# Patient Record
Sex: Male | Born: 1965 | Race: White | Hispanic: No | Marital: Married | Smoking: Never smoker
Health system: Southern US, Community
[De-identification: ages and names within clinical notes are randomized; demographics above are authoritative.]

## PROBLEM LIST (undated history)

## (undated) DIAGNOSIS — R51 Headache: Secondary | ICD-10-CM

## (undated) DIAGNOSIS — Z8719 Personal history of other diseases of the digestive system: Secondary | ICD-10-CM

## (undated) DIAGNOSIS — I10 Essential (primary) hypertension: Secondary | ICD-10-CM

## (undated) DIAGNOSIS — R519 Headache, unspecified: Secondary | ICD-10-CM

---

## 2015-09-24 DIAGNOSIS — L814 Other melanin hyperpigmentation: Secondary | ICD-10-CM | POA: Diagnosis not present

## 2015-09-24 DIAGNOSIS — L821 Other seborrheic keratosis: Secondary | ICD-10-CM | POA: Diagnosis not present

## 2015-09-24 DIAGNOSIS — L820 Inflamed seborrheic keratosis: Secondary | ICD-10-CM | POA: Diagnosis not present

## 2015-09-24 DIAGNOSIS — D225 Melanocytic nevi of trunk: Secondary | ICD-10-CM | POA: Diagnosis not present

## 2015-09-24 DIAGNOSIS — D1800 Hemangioma unspecified site: Secondary | ICD-10-CM | POA: Diagnosis not present

## 2016-02-03 DIAGNOSIS — Z1211 Encounter for screening for malignant neoplasm of colon: Secondary | ICD-10-CM | POA: Diagnosis not present

## 2016-02-03 DIAGNOSIS — Z0000 Encounter for general adult medical examination without abnormal findings: Secondary | ICD-10-CM | POA: Diagnosis not present

## 2016-02-03 DIAGNOSIS — R8290 Unspecified abnormal findings in urine: Secondary | ICD-10-CM | POA: Diagnosis not present

## 2016-02-03 DIAGNOSIS — M545 Low back pain: Secondary | ICD-10-CM | POA: Diagnosis not present

## 2016-02-03 DIAGNOSIS — E669 Obesity, unspecified: Secondary | ICD-10-CM | POA: Diagnosis not present

## 2016-02-03 DIAGNOSIS — I10 Essential (primary) hypertension: Secondary | ICD-10-CM | POA: Diagnosis not present

## 2016-02-03 DIAGNOSIS — Z125 Encounter for screening for malignant neoplasm of prostate: Secondary | ICD-10-CM | POA: Diagnosis not present

## 2016-03-15 DIAGNOSIS — R361 Hematospermia: Secondary | ICD-10-CM | POA: Diagnosis not present

## 2016-03-15 DIAGNOSIS — N411 Chronic prostatitis: Secondary | ICD-10-CM | POA: Diagnosis not present

## 2016-09-20 DIAGNOSIS — I10 Essential (primary) hypertension: Secondary | ICD-10-CM | POA: Diagnosis not present

## 2017-02-22 DIAGNOSIS — D485 Neoplasm of uncertain behavior of skin: Secondary | ICD-10-CM | POA: Diagnosis not present

## 2017-02-22 DIAGNOSIS — L918 Other hypertrophic disorders of the skin: Secondary | ICD-10-CM | POA: Diagnosis not present

## 2017-03-26 DIAGNOSIS — Z0000 Encounter for general adult medical examination without abnormal findings: Secondary | ICD-10-CM | POA: Diagnosis not present

## 2017-03-26 DIAGNOSIS — Z125 Encounter for screening for malignant neoplasm of prostate: Secondary | ICD-10-CM | POA: Diagnosis not present

## 2017-04-26 DIAGNOSIS — D485 Neoplasm of uncertain behavior of skin: Secondary | ICD-10-CM | POA: Diagnosis not present

## 2017-04-26 DIAGNOSIS — R361 Hematospermia: Secondary | ICD-10-CM | POA: Diagnosis not present

## 2017-04-26 DIAGNOSIS — D225 Melanocytic nevi of trunk: Secondary | ICD-10-CM | POA: Diagnosis not present

## 2017-04-26 DIAGNOSIS — L814 Other melanin hyperpigmentation: Secondary | ICD-10-CM | POA: Diagnosis not present

## 2017-04-26 DIAGNOSIS — D1800 Hemangioma unspecified site: Secondary | ICD-10-CM | POA: Diagnosis not present

## 2017-04-26 DIAGNOSIS — D224 Melanocytic nevi of scalp and neck: Secondary | ICD-10-CM | POA: Diagnosis not present

## 2017-04-26 DIAGNOSIS — L820 Inflamed seborrheic keratosis: Secondary | ICD-10-CM | POA: Diagnosis not present

## 2017-04-26 DIAGNOSIS — R972 Elevated prostate specific antigen [PSA]: Secondary | ICD-10-CM | POA: Diagnosis not present

## 2017-05-31 DIAGNOSIS — H5203 Hypermetropia, bilateral: Secondary | ICD-10-CM | POA: Diagnosis not present

## 2017-05-31 DIAGNOSIS — H524 Presbyopia: Secondary | ICD-10-CM | POA: Diagnosis not present

## 2017-05-31 DIAGNOSIS — H52222 Regular astigmatism, left eye: Secondary | ICD-10-CM | POA: Diagnosis not present

## 2017-10-09 DIAGNOSIS — R972 Elevated prostate specific antigen [PSA]: Secondary | ICD-10-CM | POA: Diagnosis not present

## 2017-10-24 DIAGNOSIS — R972 Elevated prostate specific antigen [PSA]: Secondary | ICD-10-CM | POA: Diagnosis not present

## 2017-10-24 DIAGNOSIS — R3121 Asymptomatic microscopic hematuria: Secondary | ICD-10-CM | POA: Diagnosis not present

## 2017-11-15 DIAGNOSIS — R3129 Other microscopic hematuria: Secondary | ICD-10-CM | POA: Diagnosis not present

## 2017-11-15 DIAGNOSIS — K429 Umbilical hernia without obstruction or gangrene: Secondary | ICD-10-CM | POA: Diagnosis not present

## 2017-11-15 DIAGNOSIS — R3121 Asymptomatic microscopic hematuria: Secondary | ICD-10-CM | POA: Diagnosis not present

## 2017-11-22 DIAGNOSIS — R3121 Asymptomatic microscopic hematuria: Secondary | ICD-10-CM | POA: Diagnosis not present

## 2017-11-23 ENCOUNTER — Other Ambulatory Visit: Payer: Self-pay | Admitting: Urology

## 2017-11-23 DIAGNOSIS — D49512 Neoplasm of unspecified behavior of left kidney: Secondary | ICD-10-CM

## 2017-12-07 ENCOUNTER — Ambulatory Visit
Admission: RE | Admit: 2017-12-07 | Discharge: 2017-12-07 | Disposition: A | Discharge status: Home / Self Care | Payer: BLUE CROSS/BLUE SHIELD | Source: Ambulatory Visit | Attending: Urology | Admitting: Urology

## 2017-12-07 DIAGNOSIS — D49512 Neoplasm of unspecified behavior of left kidney: Secondary | ICD-10-CM

## 2017-12-13 ENCOUNTER — Ambulatory Visit
Admission: RE | Admit: 2017-12-13 | Discharge: 2017-12-13 | Disposition: A | Discharge status: Home / Self Care | Payer: BLUE CROSS/BLUE SHIELD | Source: Ambulatory Visit | Attending: Urology | Admitting: Urology

## 2017-12-13 DIAGNOSIS — R3129 Other microscopic hematuria: Secondary | ICD-10-CM | POA: Diagnosis not present

## 2017-12-13 MED ORDER — GADOBENATE DIMEGLUMINE 529 MG/ML IV SOLN
20.0000 mL | Freq: Once | INTRAVENOUS | Status: AC | PRN
  Administered 2017-12-13: 20 mL via INTRAVENOUS

## 2017-12-15 ENCOUNTER — Other Ambulatory Visit: Payer: BLUE CROSS/BLUE SHIELD

## 2017-12-17 ENCOUNTER — Other Ambulatory Visit (HOSPITAL_COMMUNITY): Payer: Self-pay | Admitting: Urology

## 2017-12-17 ENCOUNTER — Ambulatory Visit (HOSPITAL_COMMUNITY)
Admission: RE | Admit: 2017-12-17 | Discharge: 2017-12-17 | Disposition: A | Discharge status: Home / Self Care | Payer: BLUE CROSS/BLUE SHIELD | Source: Ambulatory Visit | Attending: Urology | Admitting: Urology

## 2017-12-17 DIAGNOSIS — D49512 Neoplasm of unspecified behavior of left kidney: Secondary | ICD-10-CM

## 2017-12-17 DIAGNOSIS — C642 Malignant neoplasm of left kidney, except renal pelvis: Secondary | ICD-10-CM | POA: Diagnosis not present

## 2017-12-26 ENCOUNTER — Other Ambulatory Visit: Payer: Self-pay | Admitting: Urology

## 2018-01-10 DIAGNOSIS — D49512 Neoplasm of unspecified behavior of left kidney: Secondary | ICD-10-CM | POA: Diagnosis not present

## 2018-01-28 NOTE — Patient Instructions (Addendum)
DONI WIDMER  01/28/2018   Your procedure is scheduled on: 02-06-18     Report to [facility] Main  Entrance    Report to Admitting at 11:09 AM    Call this number if you have problems the morning of surgery [phone]    Remember: Do not eat food or drink liquids :After Midnight. You may have a Clear Liquid Diet from Midnight until 7:09 AM. After 7:09 AM, nothing until after surgery.      CLEAR LIQUID DIET   Foods Allowed                                                                     Foods Excluded  Coffee and tea, regular and decaf                             liquids that you cannot  Plain Jell-O in any flavor                                             see through such as: Fruit ices (not with fruit pulp)                                     milk, soups, orange juice  Iced Popsicles                                    All solid food Carbonated beverages, regular and diet                                    Cranberry, grape and apple juices Sports drinks like Gatorade Lightly seasoned clear broth or consume(fat free) Sugar, honey syrup  Sample Menu Breakfast                                Lunch                                     Supper Cranberry juice                    Beef broth                            Chicken broth Jell-O                                     Grape juice  Apple juice Coffee or tea                        Jell-O                                      Popsicle                                                Coffee or tea                        Coffee or tea  _____________________________________________________________________    BRUSH YOUR TEETH MORNING OF SURGERY AND RINSE YOUR MOUTH OUT, NO CHEWING GUM CANDY OR MINTS.     Take these medicines the morning of surgery with A SIP OF WATER: None                                You may not have any metal on your body including hair pins and        piercings  Do not wear jewelry, make-up, lotions, powders or perfumes, deodorant             Men may shave face and neck.   Do not bring valuables to the hospital. [place].  Contacts, dentures or bridgework may not be worn into surgery.  Leave suitcase in the car. After surgery it may be brought to your room.    Special Instructions: N/A              Please read over the following fact sheets you were given: _____________________________________________________________________           [facility] - Preparing for Surgery Before surgery, you can play an important role.  Because skin is not sterile, your skin needs to be as free of germs as possible.  You can reduce the number of germs on your skin by washing with CHG (chlorahexidine gluconate) soap before surgery.  CHG is an antiseptic cleaner which kills germs and bonds with the skin to continue killing germs even after washing. Please DO NOT use if you have an allergy to CHG or antibacterial soaps.  If your skin becomes reddened/irritated stop using the CHG and inform your nurse when you arrive at Short Stay. Do not shave (including legs and underarms) for at least 48 hours prior to the first CHG shower.  You may shave your face/neck. Please follow these instructions carefully:  1.  Shower with CHG Soap the night before surgery and the  morning of Surgery.  2.  If you choose to wash your hair, wash your hair first as usual with your  normal  shampoo.  3.  After you shampoo, rinse your hair and body thoroughly to remove the  shampoo.                           4.  Use CHG as you would any other liquid soap.  You can apply chg directly  to the skin and wash  Gently with a scrungie or clean washcloth.  5.  Apply the CHG Soap to your body ONLY FROM THE NECK DOWN.   Do not use on face/ open                           Wound or open sores. Avoid contact with eyes, ears  mouth and genitals (private parts).                       Wash face,  Genitals (private parts) with your normal soap.             6.  Wash thoroughly, paying special attention to the area where your surgery  will be performed.  7.  Thoroughly rinse your body with warm water from the neck down.  8.  DO NOT shower/wash with your normal soap after using and rinsing off  the CHG Soap.                9.  Pat yourself dry with a clean towel.            10.  Wear clean pajamas.            11.  Place clean sheets on your bed the night of your first shower and do not  sleep with pets. Day of Surgery : Do not apply any lotions/deodorants the morning of surgery.  Please wear clean clothes to the hospital/surgery center.  FAILURE TO FOLLOW THESE INSTRUCTIONS MAY RESULT IN THE CANCELLATION OF YOUR SURGERY PATIENT SIGNATURE_________________________________  NURSE SIGNATURE__________________________________  WHAT IS A BLOOD TRANSFUSION? Blood Transfusion Information  A transfusion is the replacement of blood or some of its parts. Blood is made up of multiple cells which provide different functions.  Red blood cells carry oxygen and are used for blood loss replacement.  White blood cells fight against infection.  Platelets control bleeding.  Plasma helps clot blood.  Other blood products are available for specialized needs, such as hemophilia or other clotting disorders. BEFORE THE TRANSFUSION  Who gives blood for transfusions?   Healthy volunteers who are fully evaluated to make sure their blood is safe. This is blood bank blood. Transfusion therapy is the safest it has ever been in the practice of medicine. Before blood is taken from a donor, a complete history is taken to make sure that person has no history of diseases nor engages in risky social behavior (examples are intravenous drug use or sexual activity with multiple partners). The donor's travel history is screened to minimize risk of  transmitting infections, such as malaria. The donated blood is tested for signs of infectious diseases, such as HIV and hepatitis. The blood is then tested to be sure it is compatible with you in order to minimize the chance of a transfusion reaction. If you or a relative donates blood, this is often done in anticipation of surgery and is not appropriate for emergency situations. It takes many days to process the donated blood. RISKS AND COMPLICATIONS Although transfusion therapy is very safe and saves many lives, the main dangers of transfusion include:   Getting an infectious disease.  Developing a transfusion reaction. This is an allergic reaction to something in the blood you were given. Every precaution is taken to prevent this. The decision to have a blood transfusion has been considered carefully by your caregiver before blood is given. Blood is not given unless the benefits outweigh the  risks. AFTER THE TRANSFUSION  Right after receiving a blood transfusion, you will usually feel much better and more energetic. This is especially true if your red blood cells have gotten low (anemic). The transfusion raises the level of the red blood cells which carry oxygen, and this usually causes an energy increase.  The nurse administering the transfusion will monitor you carefully for complications. HOME CARE INSTRUCTIONS  No special instructions are needed after a transfusion. You may find your energy is better. Speak with your caregiver about any limitations on activity for underlying diseases you may have. SEEK MEDICAL CARE IF:   Your condition is not improving after your transfusion.  You develop redness or irritation at the intravenous (IV) site. SEEK IMMEDIATE MEDICAL CARE IF:  Any of the following symptoms occur over the next 12 hours:  Shaking chills.  You have a temperature by mouth above 102 F (38.9 C), not controlled by medicine.  Chest, back, or muscle pain.  People around you  feel you are not acting correctly or are confused.  Shortness of breath or difficulty breathing.  Dizziness and fainting.  You get a rash or develop hives.  You have a decrease in urine output.  Your urine turns a dark color or changes to pink, red, or brown. Any of the following symptoms occur over the next 10 days:  You have a temperature by mouth above 102 F (38.9 C), not controlled by medicine.  Shortness of breath.  Weakness after normal activity.  The white part of the eye turns yellow (jaundice).  You have a decrease in the amount of urine or are urinating less often.  Your urine turns a dark color or changes to pink, red, or brown. Document Released: 02/18/2000 Document Revised: 05/15/2011 Document Reviewed: 10/07/2007 [facility] Patient Information 2014 [employer], [place].  _______________________________________________________________________ ________________________________________________________________________

## 2018-01-29 ENCOUNTER — Other Ambulatory Visit: Payer: Self-pay

## 2018-01-29 ENCOUNTER — Encounter (HOSPITAL_COMMUNITY)
Admission: RE | Admit: 2018-01-29 | Discharge: 2018-01-29 | Disposition: A | Discharge status: Home / Self Care | Payer: BLUE CROSS/BLUE SHIELD | Source: Ambulatory Visit | Attending: Urology | Admitting: Urology

## 2018-01-29 ENCOUNTER — Encounter (HOSPITAL_COMMUNITY): Payer: Self-pay

## 2018-01-29 DIAGNOSIS — I10 Essential (primary) hypertension: Secondary | ICD-10-CM | POA: Insufficient documentation

## 2018-01-29 DIAGNOSIS — N2889 Other specified disorders of kidney and ureter: Secondary | ICD-10-CM | POA: Insufficient documentation

## 2018-01-29 DIAGNOSIS — Z01818 Encounter for other preprocedural examination: Secondary | ICD-10-CM | POA: Diagnosis not present

## 2018-01-29 HISTORY — DX: Essential (primary) hypertension: I10

## 2018-01-29 HISTORY — DX: Headache, unspecified: R51.9

## 2018-01-29 HISTORY — DX: Personal history of other diseases of the digestive system: Z87.19

## 2018-01-29 HISTORY — DX: Headache: R51

## 2018-01-29 LAB — BASIC METABOLIC PANEL
Anion gap: 7 (ref 5–15)
BUN: 14 mg/dL (ref 6–20)
CALCIUM: 9.3 mg/dL (ref 8.9–10.3)
CO2: 29 mmol/L (ref 22–32)
CREATININE: 1.07 mg/dL (ref 0.61–1.24)
Chloride: 105 mmol/L (ref 98–111)
GFR calc Af Amer: >60 mL/min (ref >60)
GLUCOSE: 88 mg/dL (ref 70–99)
Potassium: 4 mmol/L (ref 3.5–5.1)
Sodium: 141 mmol/L (ref 135–145)

## 2018-01-29 LAB — ABO/RH: ABO/RH(D): O POS

## 2018-01-29 LAB — CBC
HCT: 48.7 % (ref 39.0–52.0)
Hemoglobin: 16.3 g/dL (ref 13.0–17.0)
MCH: 29.8 pg (ref 26.0–34.0)
MCHC: 33.5 g/dL (ref 30.0–36.0)
MCV: 89 fL (ref 80.0–100.0)
PLATELETS: 297 10*3/uL (ref 150–400)
RBC: 5.47 MIL/uL (ref 4.22–5.81)
RDW: 11.5 % (ref 11.5–15.5)
WBC: 7.9 10*3/uL (ref 4.0–10.5)
nRBC: 0 % (ref 0.0–0.2)

## 2018-02-05 MED ORDER — DEXTROSE 5 % IV SOLN
3.0000 g | Freq: Once | INTRAVENOUS | Status: AC
  Administered 2018-02-06: 3 g via INTRAVENOUS
  Filled 2018-02-05: qty 3

## 2018-02-06 ENCOUNTER — Ambulatory Visit (HOSPITAL_COMMUNITY): Payer: BLUE CROSS/BLUE SHIELD | Admitting: Certified Registered"

## 2018-02-06 ENCOUNTER — Encounter (HOSPITAL_COMMUNITY)
Admission: RE | Disposition: A | Discharge status: Home / Self Care | Payer: Self-pay | Source: Ambulatory Visit | Attending: Urology

## 2018-02-06 ENCOUNTER — Observation Stay (HOSPITAL_COMMUNITY)
Admission: RE | Admit: 2018-02-06 | Discharge: 2018-02-07 | Disposition: A | Discharge status: Home / Self Care | Payer: BLUE CROSS/BLUE SHIELD | Source: Ambulatory Visit | Attending: Urology | Admitting: Urology

## 2018-02-06 ENCOUNTER — Encounter (HOSPITAL_COMMUNITY): Payer: Self-pay | Admitting: Certified Registered"

## 2018-02-06 DIAGNOSIS — N2889 Other specified disorders of kidney and ureter: Secondary | ICD-10-CM | POA: Diagnosis not present

## 2018-02-06 DIAGNOSIS — Z79899 Other long term (current) drug therapy: Secondary | ICD-10-CM | POA: Diagnosis not present

## 2018-02-06 DIAGNOSIS — I10 Essential (primary) hypertension: Secondary | ICD-10-CM | POA: Diagnosis not present

## 2018-02-06 DIAGNOSIS — C642 Malignant neoplasm of left kidney, except renal pelvis: Secondary | ICD-10-CM | POA: Diagnosis not present

## 2018-02-06 DIAGNOSIS — D49512 Neoplasm of unspecified behavior of left kidney: Secondary | ICD-10-CM | POA: Diagnosis not present

## 2018-02-06 HISTORY — PX: ROBOTIC ASSITED PARTIAL NEPHRECTOMY: SHX6087

## 2018-02-06 HISTORY — PX: CYSTOSCOPY: SHX5120

## 2018-02-06 LAB — HEMOGLOBIN AND HEMATOCRIT, BLOOD
HCT: 46.1 % (ref 39.0–52.0)
Hemoglobin: 15.6 g/dL (ref 13.0–17.0)

## 2018-02-06 LAB — TYPE AND SCREEN
ABO/RH(D): O POS
ANTIBODY SCREEN: NEGATIVE

## 2018-02-06 SURGERY — NEPHRECTOMY, PARTIAL, ROBOT-ASSISTED
Anesthesia: General

## 2018-02-06 MED ORDER — MIDAZOLAM HCL 2 MG/2ML IJ SOLN
INTRAMUSCULAR | Status: DC | PRN
  Administered 2018-02-06 (×2): 1 mg via INTRAVENOUS

## 2018-02-06 MED ORDER — BUPIVACAINE LIPOSOME 1.3 % IJ SUSP
10.0000 mL | Freq: Once | INTRAMUSCULAR | Status: DC
  Administered 2018-02-06: 10 mL
  Filled 2018-02-06: qty 20

## 2018-02-06 MED ORDER — DOCUSATE SODIUM 100 MG PO CAPS: 100.0000 mg | ORAL_CAPSULE | Freq: Two times a day (BID) | ORAL | Status: AC

## 2018-02-06 MED ORDER — SUGAMMADEX SODIUM 500 MG/5ML IV SOLN
INTRAVENOUS | Status: AC
  Filled 2018-02-06: qty 5

## 2018-02-06 MED ORDER — SUGAMMADEX SODIUM 500 MG/5ML IV SOLN
INTRAVENOUS | Status: DC | PRN
  Administered 2018-02-06: 300 mg via INTRAVENOUS

## 2018-02-06 MED ORDER — LACTATED RINGERS IV SOLN
INTRAVENOUS | Status: DC
  Administered 2018-02-06 (×3): via INTRAVENOUS

## 2018-02-06 MED ORDER — ACETAMINOPHEN 325 MG PO TABS
650.0000 mg | ORAL_TABLET | ORAL | Status: DC | PRN
  Administered 2018-02-06 – 2018-02-07 (×4): 650 mg via ORAL
  Filled 2018-02-06 (×4): qty 2

## 2018-02-06 MED ORDER — PROMETHAZINE HCL 25 MG/ML IJ SOLN: 6.2500 mg | INTRAMUSCULAR | Status: DC | PRN

## 2018-02-06 MED ORDER — CEFAZOLIN SODIUM-DEXTROSE 1-4 GM/50ML-% IV SOLN
1.0000 g | Freq: Once | INTRAVENOUS | Status: AC
  Administered 2018-02-06: 1 g via INTRAVENOUS
  Filled 2018-02-06: qty 50

## 2018-02-06 MED ORDER — FENTANYL CITRATE (PF) 250 MCG/5ML IJ SOLN
INTRAMUSCULAR | Status: AC
  Filled 2018-02-06: qty 5

## 2018-02-06 MED ORDER — MIDAZOLAM HCL 2 MG/2ML IJ SOLN
INTRAMUSCULAR | Status: AC
  Filled 2018-02-06: qty 2

## 2018-02-06 MED ORDER — ROCURONIUM BROMIDE 10 MG/ML (PF) SYRINGE
PREFILLED_SYRINGE | INTRAVENOUS | Status: AC
  Filled 2018-02-06: qty 10

## 2018-02-06 MED ORDER — MEPERIDINE HCL 50 MG/ML IJ SOLN: 6.2500 mg | INTRAMUSCULAR | Status: DC | PRN

## 2018-02-06 MED ORDER — ONDANSETRON HCL 4 MG/2ML IJ SOLN
INTRAMUSCULAR | Status: AC
  Filled 2018-02-06: qty 2

## 2018-02-06 MED ORDER — HYDROMORPHONE HCL 1 MG/ML IJ SOLN: 0.2500 mg | INTRAMUSCULAR | Status: DC | PRN

## 2018-02-06 MED ORDER — STERILE WATER FOR IRRIGATION IR SOLN
Status: DC | PRN
  Administered 2018-02-06: 1000 mL via INTRAVESICAL

## 2018-02-06 MED ORDER — ONDANSETRON HCL 4 MG/2ML IJ SOLN: 4.0000 mg | INTRAMUSCULAR | Status: DC | PRN

## 2018-02-06 MED ORDER — PROPOFOL 10 MG/ML IV BOLUS
INTRAVENOUS | Status: DC | PRN
  Administered 2018-02-06: 200 mg via INTRAVENOUS

## 2018-02-06 MED ORDER — SUCCINYLCHOLINE CHLORIDE 200 MG/10ML IV SOSY
PREFILLED_SYRINGE | INTRAVENOUS | Status: DC | PRN
  Administered 2018-02-06: 120 mg via INTRAVENOUS

## 2018-02-06 MED ORDER — DIPHENHYDRAMINE HCL 12.5 MG/5ML PO ELIX: 12.5000 mg | ORAL_SOLUTION | Freq: Four times a day (QID) | ORAL | Status: DC | PRN

## 2018-02-06 MED ORDER — OXYCODONE HCL 5 MG PO TABS
5.0000 mg | ORAL_TABLET | ORAL | Status: DC | PRN
  Administered 2018-02-06 – 2018-02-07 (×2): 5 mg via ORAL
  Filled 2018-02-06 (×3): qty 1

## 2018-02-06 MED ORDER — HYDROCODONE-ACETAMINOPHEN 5-325 MG PO TABS: 1.0000 | ORAL_TABLET | Freq: Four times a day (QID) | ORAL | 0 refills | Status: AC | PRN

## 2018-02-06 MED ORDER — FENTANYL CITRATE (PF) 250 MCG/5ML IJ SOLN
INTRAMUSCULAR | Status: DC | PRN
  Administered 2018-02-06 (×5): 50 ug via INTRAVENOUS
  Administered 2018-02-06: 100 ug via INTRAVENOUS
  Administered 2018-02-06: 50 ug via INTRAVENOUS
  Administered 2018-02-06: 100 ug via INTRAVENOUS
  Administered 2018-02-06: 50 ug via INTRAVENOUS
  Administered 2018-02-06: 100 ug via INTRAVENOUS
  Administered 2018-02-06: 50 ug via INTRAVENOUS

## 2018-02-06 MED ORDER — ONDANSETRON HCL 4 MG/2ML IJ SOLN
INTRAMUSCULAR | Status: DC | PRN
  Administered 2018-02-06 (×2): 4 mg via INTRAVENOUS

## 2018-02-06 MED ORDER — DEXAMETHASONE SODIUM PHOSPHATE 10 MG/ML IJ SOLN
INTRAMUSCULAR | Status: DC | PRN
  Administered 2018-02-06: 10 mg via INTRAVENOUS

## 2018-02-06 MED ORDER — HYDROMORPHONE HCL 1 MG/ML IJ SOLN: 0.5000 mg | INTRAMUSCULAR | Status: DC | PRN

## 2018-02-06 MED ORDER — EVICEL 5 ML EX KIT
PACK | Freq: Once | CUTANEOUS | Status: AC
  Administered 2018-02-06: 1
  Filled 2018-02-06: qty 1

## 2018-02-06 MED ORDER — KETOROLAC TROMETHAMINE 30 MG/ML IJ SOLN: 30.0000 mg | Freq: Once | INTRAMUSCULAR | Status: DC | PRN

## 2018-02-06 MED ORDER — BUPIVACAINE-EPINEPHRINE (PF) 0.25% -1:200000 IJ SOLN
INTRAMUSCULAR | Status: AC
  Filled 2018-02-06: qty 30

## 2018-02-06 MED ORDER — BUPIVACAINE-EPINEPHRINE 0.25% -1:200000 IJ SOLN
INTRAMUSCULAR | Status: DC | PRN
  Administered 2018-02-06: 30 mL

## 2018-02-06 MED ORDER — ROCURONIUM BROMIDE 10 MG/ML (PF) SYRINGE
PREFILLED_SYRINGE | INTRAVENOUS | Status: DC | PRN
  Administered 2018-02-06: 10 mg via INTRAVENOUS
  Administered 2018-02-06: 5 mg via INTRAVENOUS
  Administered 2018-02-06 (×3): 10 mg via INTRAVENOUS
  Administered 2018-02-06: 20 mg via INTRAVENOUS
  Administered 2018-02-06: 40 mg via INTRAVENOUS

## 2018-02-06 MED ORDER — STERILE WATER FOR IRRIGATION IR SOLN
Status: DC | PRN
  Administered 2018-02-06: 1000 mL

## 2018-02-06 MED ORDER — DEXTROSE-NACL 5-0.45 % IV SOLN
INTRAVENOUS | Status: DC
  Administered 2018-02-06 – 2018-02-07 (×2): via INTRAVENOUS

## 2018-02-06 MED ORDER — PROPOFOL 10 MG/ML IV BOLUS
INTRAVENOUS | Status: AC
  Filled 2018-02-06: qty 20

## 2018-02-06 MED ORDER — PROMETHAZINE HCL 12.5 MG PO TABS: 12.5000 mg | ORAL_TABLET | ORAL | 0 refills | Status: AC | PRN

## 2018-02-06 MED ORDER — LIDOCAINE 2% (20 MG/ML) 5 ML SYRINGE
INTRAMUSCULAR | Status: DC | PRN
  Administered 2018-02-06: 100 mg via INTRAVENOUS

## 2018-02-06 MED ORDER — DIPHENHYDRAMINE HCL 50 MG/ML IJ SOLN: 12.5000 mg | Freq: Four times a day (QID) | INTRAMUSCULAR | Status: DC | PRN

## 2018-02-06 MED ORDER — BUPIVACAINE LIPOSOME 1.3 % IJ SUSP
10.0000 mL | Freq: Once | INTRAMUSCULAR | Status: DC
  Filled 2018-02-06: qty 10

## 2018-02-06 MED ORDER — DEXAMETHASONE SODIUM PHOSPHATE 10 MG/ML IJ SOLN
INTRAMUSCULAR | Status: AC
  Filled 2018-02-06: qty 1

## 2018-02-06 SURGICAL SUPPLY — 65 items
APPLICATOR SURGIFLO ENDO (HEMOSTASIS) IMPLANT
CHLORAPREP W/TINT 26ML (MISCELLANEOUS) ×3 IMPLANT
CLIP SUT LAPRA TY ABSORB (SUTURE) ×3 IMPLANT
CLIP VESOCCLUDE MED LG 6/CT (CLIP) IMPLANT
CLIP VESOLOCK LG 6/CT PURPLE (CLIP) ×3 IMPLANT
CLIP VESOLOCK MED LG 6/CT (CLIP) ×9 IMPLANT
CLIP VESOLOCK XL 6/CT (CLIP) ×3 IMPLANT
COVER SURGICAL LIGHT HANDLE (MISCELLANEOUS) ×3 IMPLANT
COVER TIP SHEARS 8 DVNC (MISCELLANEOUS) ×2 IMPLANT
COVER TIP SHEARS 8MM DA VINCI (MISCELLANEOUS) ×1
COVER WAND RF STERILE (DRAPES) ×3 IMPLANT
DECANTER SPIKE VIAL GLASS SM (MISCELLANEOUS) ×3 IMPLANT
DERMABOND ADVANCED (GAUZE/BANDAGES/DRESSINGS) ×1
DERMABOND ADVANCED .7 DNX12 (GAUZE/BANDAGES/DRESSINGS) ×2 IMPLANT
DRAIN CHANNEL 15F RND FF 3/16 (WOUND CARE) IMPLANT
DRAPE ARM DVNC X/XI (DISPOSABLE) ×8 IMPLANT
DRAPE COLUMN DVNC XI (DISPOSABLE) ×2 IMPLANT
DRAPE DA VINCI XI ARM (DISPOSABLE) ×4
DRAPE DA VINCI XI COLUMN (DISPOSABLE) ×1
DRAPE INCISE IOBAN 66X45 STRL (DRAPES) ×3 IMPLANT
DRAPE SHEET LG 3/4 BI-LAMINATE (DRAPES) ×3 IMPLANT
ELECT PENCIL ROCKER SW 15FT (MISCELLANEOUS) ×3 IMPLANT
ELECT REM PT RETURN 15FT ADLT (MISCELLANEOUS) ×3 IMPLANT
EVACUATOR SILICONE 100CC (DRAIN) IMPLANT
GLOVE BIO SURGEON STRL SZ 6.5 (GLOVE) ×3 IMPLANT
GLOVE BIOGEL M STRL SZ7.5 (GLOVE) ×6 IMPLANT
GOWN STRL REUS W/TWL LRG LVL3 (GOWN DISPOSABLE) ×6 IMPLANT
GOWN STRL REUS W/TWL XL LVL3 (GOWN DISPOSABLE) ×6 IMPLANT
HEMOSTAT SURGICEL 4X8 (HEMOSTASIS) ×3 IMPLANT
IRRIG SUCT STRYKERFLOW 2 WTIP (MISCELLANEOUS) ×3
IRRIGATION SUCT STRKRFLW 2 WTP (MISCELLANEOUS) ×2 IMPLANT
KIT BASIN OR (CUSTOM PROCEDURE TRAY) ×3 IMPLANT
MARKER SKIN DUAL TIP RULER LAB (MISCELLANEOUS) ×3 IMPLANT
NEEDLE INSUFFLATION 120MM (ENDOMECHANICALS) ×3 IMPLANT
NEEDLE INSUFFLATION 14GA 120MM (NEEDLE) ×3 IMPLANT
PORT ACCESS TROCAR AIRSEAL 12 (TROCAR) ×2 IMPLANT
PORT ACCESS TROCAR AIRSEAL 5M (TROCAR) ×1
POUCH SPECIMEN RETRIEVAL 10MM (ENDOMECHANICALS) ×3 IMPLANT
PROTECTOR NERVE ULNAR (MISCELLANEOUS) ×6 IMPLANT
RELOAD STAPLER WHITE 60MM (STAPLE) IMPLANT
SEAL CANN UNIV 5-8 DVNC XI (MISCELLANEOUS) ×8 IMPLANT
SEAL XI 5MM-8MM UNIVERSAL (MISCELLANEOUS) ×4
SET IRRIG Y TYPE TUR BLADDER L (SET/KITS/TRAYS/PACK) ×3 IMPLANT
SET TRI-LUMEN FLTR TB AIRSEAL (TUBING) ×3 IMPLANT
SOLUTION ELECTROLUBE (MISCELLANEOUS) ×3 IMPLANT
STAPLE ECHEON FLEX 60 POW ENDO (STAPLE) IMPLANT
STAPLER RELOAD WHITE 60MM (STAPLE)
SURGIFLO W/THROMBIN 8M KIT (HEMOSTASIS) IMPLANT
SUT ETHILON 2 0 PSLX (SUTURE) IMPLANT
SUT MNCRL AB 4-0 PS2 18 (SUTURE) ×6 IMPLANT
SUT PDS AB 0 CT1 36 (SUTURE) ×6 IMPLANT
SUT V-LOC BARB 180 2/0GR6 GS22 (SUTURE) ×6
SUT VIC AB 1 CT1 27 (SUTURE) ×4
SUT VIC AB 1 CT1 27XBRD ANTBC (SUTURE) ×8 IMPLANT
SUT VICRYL 0 UR6 27IN ABS (SUTURE) ×3 IMPLANT
SUT VLOC BARB 180 ABS3/0GR12 (SUTURE) ×3
SUTURE V-LC BRB 180 2/0GR6GS22 (SUTURE) ×4 IMPLANT
SUTURE VLOC BRB 180 ABS3/0GR12 (SUTURE) ×2 IMPLANT
TIP RIGID 35CM EVICEL (HEMOSTASIS) IMPLANT
TOWEL OR 17X26 10 PK STRL BLUE (TOWEL DISPOSABLE) ×3 IMPLANT
TOWEL OR NON WOVEN STRL DISP B (DISPOSABLE) ×3 IMPLANT
TRAY FOLEY MTR SLVR 16FR STAT (SET/KITS/TRAYS/PACK) ×3 IMPLANT
TRAY LAPAROSCOPIC (CUSTOM PROCEDURE TRAY) ×3 IMPLANT
TROCAR BLADELESS OPT 5 100 (ENDOMECHANICALS) IMPLANT
WATER STERILE IRR 1000ML POUR (IV SOLUTION) ×3 IMPLANT

## 2018-02-06 NOTE — Anesthesia Procedure Notes (Addendum)
Procedure Name: Intubation Date/Time: 02/06/2018 2:31 PM Performed by: Cynda Familia, CRNA Pre-anesthesia Checklist: Patient identified, Emergency Drugs available, Suction available and Patient being monitored Patient Re-evaluated:Patient Re-evaluated prior to induction Oxygen Delivery Method: Circle System Utilized Preoxygenation: Pre-oxygenation with 100% oxygen Induction Type: IV induction Ventilation: Mask ventilation without difficulty Laryngoscope Size: Miller and 2 Grade View: Grade I Tube type: Oral Tube size: 7.5 mm Number of attempts: 1 Airway Equipment and Method: Stylet Placement Confirmation: ETT inserted through vocal cords under direct vision,  positive ETCO2 and breath sounds checked- equal and bilateral Secured at: 22 cm Tube secured with: Tape Dental Injury: Teeth and Oropharynx as per pre-operative assessment  Comments: Smooth IV induction-- Hatchette--- intubation AM CRNA atraumatic-- right front with small chip prior to laryngoscopy ( not reported by pt upon preop interview)-- unchanged with intubation-- bilat BS--- dental advisory given by CRNA preop

## 2018-02-06 NOTE — Discharge Instructions (Signed)

## 2018-02-06 NOTE — Discharge Summary (Signed)
[facility] Discharge Summary  Admit date: 02/06/2018  Discharge date and time: 02/06/18   Discharge to: Home  Discharge Service: Urology  Discharge Attending Physician: Ellison Hughs, MD  Discharge  Diagnoses: Left renal mass  Secondary Diagnosis: migraines, hiatal heria, HTN  OR Procedures: Procedure(s): XI ROBOTIC ASSITED PARTIAL NEPHRECTOMY CYSTOSCOPY FLEXIBLE 02/06/2018   Ancillary Procedures: None   Discharge Day Services: The patient was seen and examined by the Urology team both in the morning and immediately prior to discharge.  Vital signs and laboratory values were stable and within normal limits.  The physical exam was benign and unchanged and all surgical wounds were examined.  Discharge instructions were explained and all questions answered.  Subjective  No acute events overnight. Pain Controlled. No fever or chills.  Objective Patient Vitals for the past 8 hrs:  BP Temp Temp src Pulse Resp SpO2 Height Weight  02/06/18 1151 - - - - - - 6\' 2"  (1.88 m) 120.2 kg  02/06/18 1121 (!) 147/105 98.3 F (36.8 C) Oral 82 20 100 % - -   Total I/O In: 2000 [I.V.:2000] Out: 300 [Urine:150; Blood:150]  General Appearance:        No acute distress Lungs:                       Normal work of breathing on room air Heart:                                Regular rate and rhythm Abdomen:                         Soft, non-tender, non-distended Extremities:                      Warm and well perfused   Hospital Course:  The patient underwent left robot assisted laparoscopic partial nephrectomy on 02/06/2018.  The patient tolerated the procedure well, was extubated in the OR, and afterwards was taken to the PACU for routine post-surgical care. When stable the patient was transferred to the floor. The patient did well postoperatively. The patient's diet was slowly advanced and at the time of discharge was tolerating a regular diet. The patient was discharged home Day of  Surgery, at which point was tolerating a regular solid diet, was able to void spontaneously, have adequate pain control with P.O. pain medication, and could ambulate without difficulty. The patient will follow up with [place] for post op check.   Condition at Discharge: Improved  Discharge Medications:  Allergies as of 02/07/2018   No Known Allergies     Medication List    STOP taking these medications   ibuprofen 200 MG tablet Commonly known as:  ADVIL,MOTRIN     TAKE these medications   acetaminophen 500 MG tablet Commonly known as:  TYLENOL Take 1,000 mg by mouth every 6 (six) hours as needed for moderate pain.   docusate sodium 100 MG capsule Commonly known as:  COLACE Take 1 capsule (100 mg total) by mouth 2 (two) times daily.   HYDROcodone-acetaminophen 5-325 MG tablet Commonly known as:  NORCO/VICODIN Take 1-2 tablets by mouth every 6 (six) hours as needed for moderate pain.   lisinopril-hydrochlorothiazide 20-12.5 MG tablet Commonly known as:  PRINZIDE,ZESTORETIC Take 1 tablet by mouth daily.   promethazine 12.5 MG tablet Commonly known as:  PHENERGAN Take 1 tablet (12.5  mg total) by mouth every 4 (four) hours as needed for nausea or vomiting.

## 2018-02-06 NOTE — Anesthesia Procedure Notes (Signed)
Date/Time: 02/06/2018 5:38 PM Performed by: Cynda Familia, CRNA Oxygen Delivery Method: Simple face mask Placement Confirmation: positive ETCO2 and breath sounds checked- equal and bilateral Dental Injury: Teeth and Oropharynx as per pre-operative assessment

## 2018-02-06 NOTE — Transfer of Care (Signed)
Immediate Anesthesia Transfer of Care Note  Patient: ZYSHONNE MALECHA  Procedure(s) Performed: XI ROBOTIC ASSITED PARTIAL NEPHRECTOMY (Left ) CYSTOSCOPY FLEXIBLE (N/A )  Patient Location: PACU  Anesthesia Type:General  Level of Consciousness: awake and alert   Airway & Oxygen Therapy: Patient Spontanous Breathing and Patient connected to face mask oxygen  Post-op Assessment: Report given to RN and Post -op Vital signs reviewed and stable  Post vital signs: Reviewed and stable  Last Vitals:  Vitals Value Taken Time  BP 147/93 02/06/2018  5:50 PM  Temp    Pulse 78 02/06/2018  5:55 PM  Resp 11 02/06/2018  5:55 PM  SpO2 100 % 02/06/2018  5:55 PM  Vitals shown include unvalidated device data.  Last Pain:  Vitals:   02/06/18 1151  TempSrc:   PainSc: 0-No pain         Complications: No apparent anesthesia complications

## 2018-02-06 NOTE — Op Note (Signed)
Operative Note  Preoperative diagnosis:  1.  2.1 cm left renal mass 2.  Microscopic hematuria  Postoperative diagnosis: 1.  Same 2.  Microscopic hematuria  Procedure(s): 1.  Robot assisted laparoscopic left partial nephrectomy 2.  Intra-operative ultrasound of single retroperitoneal organ 3.  Cystoscopy   Surgeon: Ellison Hughs, MD  Assistants:   1.  Debbrah Alar, [facility] An assistant was required for this surgical procedure.  The duties of the assistant included but were not limited to suctioning, passing suture, camera manipulation, retraction.  This procedure would not be able to be performed without an Environmental consultant.   2.  Andee Poles, MD PGY-4  Anesthesia:  General  Complications:  None  EBL:  150 mL   Fluids: 2800 mL (crystalloid)  Specimens: 1.  Left renal mass  Drains/Catheters: 1.  Foley catheter  Intraoperative findings:   1. Grossly negative surgical margins following left renal mass excision 2. The renorrhaphy was hemostatic at the conclusion of the case  Indication:  Larry Bender is a 52 y.o. male with a microscopic hematuria as well as a solid and enhancing 2.1 cm left renal mass seen on CT urogram and MRI abdomen with and without contrast.  He has been consented for the above procedures, voices understanding and wished to proceed.  Description of procedure:  After informed consent was obtained, the patient was brought to the operating room and general endotracheal anesthesia was administered. The patient was then placed in the dorsolithotomy position and prepped and draped in usual sterile fashion. A timeout was performed. A 16 French flexible cystoscope was then inserted into the urethral meatus and advanced into the bladder under direct vision. A complete bladder survey revealed no intravesical pathology.  The patient was then placed in the right lateral decubitus position and prepped and draped in usual sterile fashion.  A timeout was performed.   An 8 mm incision was then made lateral to the left rectus muscle at the level of the left 12th rib.  Abdominal access was obtained via a Veress needle.  The abdominal cavity was then insufflated up to 15 mmHg.  An 8 mm port was then introduced into the abdominal cavity.  Inspection of the port entry site by the robotic camera revealed no adjacent organ injury.  We then placed 3 additional 8 mm robotic ports to triangulate the left renal hilum.  A 12 mm assistant port was then placed between the carmera port and 3rd robotic arm.  The white line of Toldt along the descending colon was incised sharply and the colon, along with its mesocolonic fat, was reflected medially until the aorta was identified.  We then made a small window adjacent to the lower pole of the left kidney, identifying the left psoas muscle, left ureter and left gonadal vein.  The left ureter and gonadal vein were then reflected anteriorly allowing [place] to then incised the perihilar attachments using electrocautery.  We encountered a small lumbar vein adjacent to the insertion of the left gonadal vein into the left renal vein.  This lumbar vein was ligated with hemo-lock clips in 2 places and incised sharply.  This provided [place] excellent exposure to the left renal hilum.  The perilymphatic tissue surrounding the left renal artery was carefully dissected away, creating a window to place a bulldog clamp later in the procedure.  The anterior portion of Gerota's fascia was incised, allowing reflection the perinephric fat medially and laterally until there was adequate exposure of the posterior left renal mass.  Intraoperative ultrasound confirmed the heterogenous echogenicity of the lesion compared to the remainder of the renal parenchyma and allowed identification of the depth/borders of the mass, which were demarcated using electrocautery along the renal capsule.  We then exposed the left renal artery and placed a bulldog clamp, marking warm ischemia  time.  The left kidney immediately became ischemic and pale in appearance.  The left renal mass was then sharply excised with minimal blood loss.  After the mass was free, it was placed in the left upper quadrant to be retrieved later on during the operation.    The renorrhaphy was then performed using a 3-0 V-lock in the deep layer of the renal parenchyma and then a series of horizontal mattress sutures were used to reapproximate the renal capsule using interupted 1-0 Vicryl suture along with hemo-lock clips act as a buttress against the renal capsule.  Once adequate tension was placed on our sutures and the repair appeared hemostatic, we then removed the bulldog clamp marking the end of warm ischemia time at 16 minutes.  There is did not appear to be any obvious bleeding around the renal hilum nor surrounding our repair.  The incised Gerota's fascia overlying the mass was then reapproximated using a running 2-0 V lock suture.  The mass was then placed in an Endo Catch bag.  The robot was then de-docked and the camera was then reinserted into the assistant port. Laparoscopic graspers were then used to grab the string of the Endo Catch bag, which was brought out through the 12 mm assistant port.  The abdomen was then desufflated and all ports were removed.  The assistant port incision was then extended approximately 1 cm and the left renal mass, within the Endo Catch bag, was removed and sent to pathology for permanent section.  The fascia within the assistant port incision was then reapproximated using a 0 Vicryl suture.  The remainder of the incisions were then closed using 4-0 Monocryl and dressed appropriately.  Patient tolerated the procedure well and was transferred to the postanesthesia unit in stable condition.  Warm Ischemia Time: 16 minutes   Plan:  Bed rest overnight.  ADAT.  Foley out in the AM.

## 2018-02-06 NOTE — OR Nursing (Signed)
Clamp time [id number].

## 2018-02-06 NOTE — Anesthesia Preprocedure Evaluation (Signed)
Anesthesia Evaluation  Patient identified by MRN, date of birth, ID band Patient awake    Reviewed: Allergy & Precautions, NPO status , Patient's Chart, lab work & pertinent test results  Airway Mallampati: I       Dental no notable dental hx. (+) Teeth Intact   Pulmonary neg pulmonary ROS,    Pulmonary exam normal breath sounds clear to auscultation       Cardiovascular hypertension, Pt. on medications Normal cardiovascular exam Rhythm:Regular Rate:Normal     Neuro/Psych  Headaches, negative psych ROS   GI/Hepatic Neg liver ROS,   Endo/Other  negative endocrine ROS  Renal/GU negative Renal ROS     Musculoskeletal negative musculoskeletal ROS (+)   Abdominal (+) + obese,   Peds  Hematology negative hematology ROS (+)   Anesthesia Other Findings   Reproductive/Obstetrics                             Anesthesia Physical Anesthesia Plan  ASA: II  Anesthesia Plan: General   Post-op Pain Management:    Induction: Intravenous  PONV Risk Score and Plan: 4 or greater and Ondansetron, Dexamethasone and Midazolam  Airway Management Planned: Oral ETT  Additional Equipment:   Intra-op Plan:   Post-operative Plan: Extubation in OR  Informed Consent: I have reviewed the patients History and Physical, chart, labs and discussed the procedure including the risks, benefits and alternatives for the proposed anesthesia with the patient or authorized representative who has indicated his/her understanding and acceptance.   Dental advisory given  Plan Discussed with: CRNA  Anesthesia Plan Comments:         Anesthesia Quick Evaluation

## 2018-02-06 NOTE — H&P (Signed)
Urology Preoperative H&P   Chief Complaint: Left renal mass  History of Present Illness: Larry Bender is a 52 y.o. male who was recently evaluated by Dr. Gloriann Loan for an elevated PSA value and microscopic hematuria. The patient had a CT urogram that showed a suspicious left renal lesion with no other remarkable GU findings, but the patient never had a cystoscopy to complete his hematuria evaluation. He then underwent an MRI (findings listed below).   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 No evidence of metastatic disease.   Last PSA-1.05 (10/2017)  Last Serum Creatinine- 1.1 (10/2017)   No personal/family history of GU malignancies. He is a non-smoker. He denies flank pain, irritating voiding sxs or hematuria since his last visit.    Past Medical History:  Diagnosis Date  . Headache    Migraine  . History of hiatal hernia   . Hypertension     No past surgical history on file.  Allergies: No Known Allergies  No family history on file.  Social History:  reports that he has never smoked. He has never used smokeless tobacco. He reports that he drinks alcohol. He reports that he does not use drugs.  ROS: A complete review of systems was performed.  All systems are negative except for pertinent findings as noted.  Physical Exam:  Vital signs in last 24 hours:   Constitutional:  Alert and oriented, No acute distress Cardiovascular: Regular rate and rhythm, No JVD Respiratory: Normal respiratory effort, Lungs clear bilaterally GI: Abdomen is soft, nontender, nondistended, no abdominal masses GU: No CVA tenderness Lymphatic: No lymphadenopathy Neurologic: Grossly intact, no focal deficits Psychiatric: Normal mood and affect  Laboratory Data:  No results for input(s): WBC, HGB, HCT, PLT in the last 72 hours.  No results for input(s): NA, K, CL,  GLUCOSE, BUN, CALCIUM, CREATININE in the last 72 hours.  Invalid input(s): CO3   No results found for this or any previous visit (from the past 24 hour(s)). No results found for this or any previous visit (from the past 240 hour(s)).  Renal Function: No results for input(s): CREATININE in the last 168 hours. Estimated Creatinine Clearance: 111.2 mL/min (by C-G formula based on SCr of 1.07 mg/dL).  Radiologic Imaging: No results found.  I independently reviewed the above imaging studies.  Assessment and Plan Larry Bender is a 52 y.o. male with a 2.1 cm left renal mass with features concerning for RCC  -I reviewed imaging results and films with the patient personally. We discussed that the left renal mass in question has features concerning for malignancy. I explained the natural history of presumed renal cell carcinoma. I reviewed the AUA guidelines for evaluation and treatment of the small renal mass. The options of active surveillance, in situ tumor ablation, partial and radical nephrectomy was discussed. The risks of robotic partial nephrectomy were discussed in detail including but not limited to: negative pathology, open conversion, completion nephrectomy, infection of the urinary tract/skin/abdominal cavity, VTE, MI/CVA, lymphatic leak, injury to adjacent solid/hollow viscus organs, bleeding requiring a blood transfusion, catastrophic bleeding, hernia formation, need for postoperative angioembolization, urinary leak requiring stent/drain, and other imponderables.   -He voices understanding and wishes to proceed with left robotic partial nephrectomy. He will have a cystoscopy intra-op.   Ellison Hughs, MD 02/06/2018, 6:55 AM  [facility] Pager: [phone]

## 2018-02-06 NOTE — Anesthesia Postprocedure Evaluation (Signed)
Anesthesia Post Note  Patient: Larry Bender  Procedure(s) Performed: XI ROBOTIC ASSITED PARTIAL NEPHRECTOMY (Left ) CYSTOSCOPY FLEXIBLE (N/A )     Patient location during evaluation: PACU Anesthesia Type: General Level of consciousness: awake and sedated Pain management: pain level controlled Vital Signs Assessment: post-procedure vital signs reviewed and stable Respiratory status: spontaneous breathing Cardiovascular status: stable Postop Assessment: no apparent nausea or vomiting Anesthetic complications: no    Last Vitals:  Vitals:   02/06/18 1815 02/06/18 1830  BP: (!) 152/88 (!) 149/94  Pulse: 73 69  Resp: 10 12  Temp:  36.7 C  SpO2: 95% 100%    Last Pain:  Vitals:   02/06/18 1830  TempSrc:   PainSc: 3    Pain Goal: Patients Stated Pain Goal: 3 (02/06/18 1830)               Huston Foley

## 2018-02-07 ENCOUNTER — Encounter (HOSPITAL_COMMUNITY): Payer: Self-pay | Admitting: Urology

## 2018-02-07 DIAGNOSIS — Z79899 Other long term (current) drug therapy: Secondary | ICD-10-CM | POA: Diagnosis not present

## 2018-02-07 DIAGNOSIS — C642 Malignant neoplasm of left kidney, except renal pelvis: Secondary | ICD-10-CM | POA: Diagnosis not present

## 2018-02-07 DIAGNOSIS — I10 Essential (primary) hypertension: Secondary | ICD-10-CM | POA: Diagnosis not present

## 2018-02-07 LAB — HEMOGLOBIN AND HEMATOCRIT, BLOOD
HCT: 44.8 % (ref 39.0–52.0)
Hemoglobin: 15 g/dL (ref 13.0–17.0)

## 2018-02-07 LAB — BASIC METABOLIC PANEL
Anion gap: 11 (ref 5–15)
BUN: 15 mg/dL (ref 6–20)
CO2: 21 mmol/L — ABNORMAL LOW (ref 22–32)
CREATININE: 1.42 mg/dL — AB (ref 0.61–1.24)
Calcium: 8.6 mg/dL — ABNORMAL LOW (ref 8.9–10.3)
Chloride: 105 mmol/L (ref 98–111)
GFR calc Af Amer: >60 mL/min (ref >60)
GFR calc non Af Amer: 56 mL/min — ABNORMAL LOW (ref >60)
Glucose, Bld: 148 mg/dL — ABNORMAL HIGH (ref 70–99)
Potassium: 4.6 mmol/L (ref 3.5–5.1)
Sodium: 137 mmol/L (ref 135–145)

## 2018-02-07 NOTE — Progress Notes (Signed)
1 Day Post-Op Subjective: No acute events overnight.  Pain controlled. Denies N/V.  Tolerating clears.   Objective: Vital signs in last 24 hours: Temp:  [97.7 F (36.5 C)-98.5 F (36.9 C)] 98.3 F (36.8 C) (12/05 0620) Pulse Rate:  [69-83] 77 (12/05 0620) Resp:  [10-20] 18 (12/05 0620) BP: (132-160)/(76-105) 145/81 (12/05 0620) SpO2:  [94 %-100 %] 98 % (12/05 0620) Weight:  [120.2 kg] 120.2 kg (12/04 1151)  Intake/Output from previous day: 12/04 0701 - 12/05 0700 In: 4430.6 [P.O.:860; I.V.:3570.6] Out: 950 [Urine:800; Blood:150]  Intake/Output this shift: Total I/O In: -  Out: 425 [Urine:425]  Physical Exam:  General: Alert and oriented CV: RRR, palpable distal pulses Lungs: CTAB, equal chest rise Abdomen: Soft, NTND, no rebound or guarding Incisions: c/d/i Gu: Foley draining clear urine Ext: NT, No erythema  Lab Results: Recent Labs    02/06/18 1804 02/07/18 0518  HGB 15.6 15.0  HCT 46.1 44.8   BMET Recent Labs    02/07/18 0518  NA 137  K 4.6  CL 105  CO2 21*  GLUCOSE 148*  BUN 15  CREATININE 1.42*  CALCIUM 8.6*     Studies/Results: No results found.  Assessment/Plan: POD 1 s/p left RAPNx  -OOBTC and ambulate -d/c Foley -ADAT -Likely home later this afternoon   LOS: 0 days   Ellison Hughs, MD [facility] Pager: [phone]  02/07/2018, 8:59 AM

## 2018-02-07 NOTE — Progress Notes (Signed)
Patient ID: Larry Bender, male   DOB: September 25, 1965, 52 y.o.   MRN: [id number] Post-op note  Subjective: The patient is doing well.  No complaints.  Denies N/V  Objective: Vital signs in last 24 hours: Temp:  [97.5 F (36.4 C)-98.6 F (37 C)] 98.6 F (37 C) (12/05 1409) Pulse Rate:  [67-83] 67 (12/05 1409) Resp:  [10-20] 16 (12/05 0935) BP: (132-160)/(76-94) 133/76 (12/05 1409) SpO2:  [94 %-100 %] 97 % (12/05 1409)  Intake/Output from previous day: 12/04 0701 - 12/05 0700 In: 4430.6 [P.O.:860; I.V.:3570.6] Out: 950 [Urine:800; Blood:150] Intake/Output this shift: Total I/O In: 440 [P.O.:440] Out: 625 [Urine:625]  Physical Exam:  General: Alert and oriented. Abdomen: Soft, Nondistended. Incisions: Clean and dry. Urine: red  Lab Results: Recent Labs    02/06/18 1804 02/07/18 0518  HGB 15.6 15.0  HCT 46.1 44.8    Assessment/Plan: POD#0   1) Continue to monitor  2) DVT prophy, clears, IS, amb, pain control   LOS: 0 days   Debbrah Alar 02/07/2018, 3:32 PM

## 2018-02-21 DIAGNOSIS — C642 Malignant neoplasm of left kidney, except renal pelvis: Secondary | ICD-10-CM | POA: Diagnosis not present

## 2018-03-21 DIAGNOSIS — C642 Malignant neoplasm of left kidney, except renal pelvis: Secondary | ICD-10-CM | POA: Diagnosis not present

## 2018-04-10 DIAGNOSIS — Z0000 Encounter for general adult medical examination without abnormal findings: Secondary | ICD-10-CM | POA: Diagnosis not present

## 2018-04-30 DIAGNOSIS — D485 Neoplasm of uncertain behavior of skin: Secondary | ICD-10-CM | POA: Diagnosis not present

## 2018-04-30 DIAGNOSIS — L570 Actinic keratosis: Secondary | ICD-10-CM | POA: Diagnosis not present

## 2018-04-30 DIAGNOSIS — D0439 Carcinoma in situ of skin of other parts of face: Secondary | ICD-10-CM | POA: Diagnosis not present

## 2018-04-30 DIAGNOSIS — Z23 Encounter for immunization: Secondary | ICD-10-CM | POA: Diagnosis not present

## 2018-04-30 DIAGNOSIS — D2362 Other benign neoplasm of skin of left upper limb, including shoulder: Secondary | ICD-10-CM | POA: Diagnosis not present

## 2018-04-30 DIAGNOSIS — D225 Melanocytic nevi of trunk: Secondary | ICD-10-CM | POA: Diagnosis not present

## 2018-06-13 DIAGNOSIS — D0439 Carcinoma in situ of skin of other parts of face: Secondary | ICD-10-CM | POA: Diagnosis not present

## 2018-08-22 ENCOUNTER — Ambulatory Visit (HOSPITAL_COMMUNITY)
Admission: RE | Admit: 2018-08-22 | Discharge: 2018-08-22 | Disposition: A | Discharge status: Home / Self Care | Payer: BC Managed Care – PPO | Source: Ambulatory Visit | Attending: Family Medicine | Admitting: Family Medicine

## 2018-08-22 ENCOUNTER — Other Ambulatory Visit (HOSPITAL_COMMUNITY): Payer: Self-pay | Admitting: Family Medicine

## 2018-08-22 ENCOUNTER — Other Ambulatory Visit: Payer: Self-pay

## 2018-08-22 DIAGNOSIS — C642 Malignant neoplasm of left kidney, except renal pelvis: Secondary | ICD-10-CM | POA: Diagnosis not present

## 2018-08-22 DIAGNOSIS — Z905 Acquired absence of kidney: Secondary | ICD-10-CM | POA: Diagnosis not present

## 2018-08-27 DIAGNOSIS — C642 Malignant neoplasm of left kidney, except renal pelvis: Secondary | ICD-10-CM | POA: Diagnosis not present

## 2018-08-27 DIAGNOSIS — C649 Malignant neoplasm of unspecified kidney, except renal pelvis: Secondary | ICD-10-CM | POA: Diagnosis not present

## 2018-08-29 DIAGNOSIS — C642 Malignant neoplasm of left kidney, except renal pelvis: Secondary | ICD-10-CM | POA: Diagnosis not present

## 2018-08-29 DIAGNOSIS — Z125 Encounter for screening for malignant neoplasm of prostate: Secondary | ICD-10-CM | POA: Diagnosis not present

## 2018-10-16 DIAGNOSIS — H00011 Hordeolum externum right upper eyelid: Secondary | ICD-10-CM | POA: Diagnosis not present

## 2018-11-07 DIAGNOSIS — B078 Other viral warts: Secondary | ICD-10-CM | POA: Diagnosis not present

## 2018-11-07 DIAGNOSIS — L0291 Cutaneous abscess, unspecified: Secondary | ICD-10-CM | POA: Diagnosis not present

## 2018-11-07 DIAGNOSIS — L820 Inflamed seborrheic keratosis: Secondary | ICD-10-CM | POA: Diagnosis not present

## 2018-11-07 DIAGNOSIS — D485 Neoplasm of uncertain behavior of skin: Secondary | ICD-10-CM | POA: Diagnosis not present

## 2019-01-21 DIAGNOSIS — L816 Other disorders of diminished melanin formation: Secondary | ICD-10-CM | POA: Diagnosis not present

## 2019-01-21 DIAGNOSIS — L821 Other seborrheic keratosis: Secondary | ICD-10-CM | POA: Diagnosis not present

## 2019-01-21 DIAGNOSIS — D1801 Hemangioma of skin and subcutaneous tissue: Secondary | ICD-10-CM | POA: Diagnosis not present

## 2019-01-21 DIAGNOSIS — Z23 Encounter for immunization: Secondary | ICD-10-CM | POA: Diagnosis not present

## 2019-01-21 DIAGNOSIS — L814 Other melanin hyperpigmentation: Secondary | ICD-10-CM | POA: Diagnosis not present

## 2019-04-15 DIAGNOSIS — Z0000 Encounter for general adult medical examination without abnormal findings: Secondary | ICD-10-CM | POA: Diagnosis not present

## 2019-04-15 DIAGNOSIS — I10 Essential (primary) hypertension: Secondary | ICD-10-CM | POA: Diagnosis not present

## 2019-05-09 DIAGNOSIS — L814 Other melanin hyperpigmentation: Secondary | ICD-10-CM | POA: Diagnosis not present

## 2019-05-09 DIAGNOSIS — D225 Melanocytic nevi of trunk: Secondary | ICD-10-CM | POA: Diagnosis not present

## 2019-05-09 DIAGNOSIS — D2230 Melanocytic nevi of unspecified part of face: Secondary | ICD-10-CM | POA: Diagnosis not present

## 2019-05-09 DIAGNOSIS — D2362 Other benign neoplasm of skin of left upper limb, including shoulder: Secondary | ICD-10-CM | POA: Diagnosis not present

## 2019-05-16 ENCOUNTER — Ambulatory Visit: Payer: Self-pay | Attending: Internal Medicine

## 2019-05-16 DIAGNOSIS — Z23 Encounter for immunization: Secondary | ICD-10-CM

## 2019-05-16 NOTE — Progress Notes (Signed)
   Covid-19 Vaccination Clinic  Name:  Larry Bender    MRN: [id number] DOB: [birth year]  05/16/2019  Larry Bender was observed post Covid-19 immunization for 15 minutes without incident. He was provided with Vaccine Information Sheet and instruction to access the V-Safe system.   Larry Bender was instructed to call 911 with any severe reactions post vaccine: Marland Kitchen Difficulty breathing  . Swelling of face and throat  . A fast heartbeat  . A bad rash all over body  . Dizziness and weakness   Immunizations Administered    Name Date Dose VIS Date Route   Pfizer COVID-19 Vaccine 05/16/2019 12:46 PM 0.3 mL 02/14/2019 Intramuscular   Manufacturer: [place]   Lot: [id number]   [place]: [id number]

## 2019-06-11 ENCOUNTER — Ambulatory Visit: Payer: Self-pay | Attending: Internal Medicine

## 2019-06-11 DIAGNOSIS — Z23 Encounter for immunization: Secondary | ICD-10-CM

## 2019-06-11 NOTE — Progress Notes (Signed)
   Covid-19 Vaccination Clinic  Name:  Larry Bender    MRN: [id number] DOB: [birth year]  06/11/2019  Mr. Gundy was observed post Covid-19 immunization for 15 minutes without incident. He was provided with Vaccine Information Sheet and instruction to access the V-Safe system.   Mr. Fallen was instructed to call 911 with any severe reactions post vaccine: Marland Kitchen Difficulty breathing  . Swelling of face and throat  . A fast heartbeat  . A bad rash all over body  . Dizziness and weakness   Immunizations Administered    Name Date Dose VIS Date Route   Pfizer COVID-19 Vaccine 06/11/2019  8:24 AM 0.3 mL 02/14/2019 Intramuscular   Manufacturer: [place]   Lot: [id number]   [place]: [id number]

## 2019-08-26 DIAGNOSIS — Z125 Encounter for screening for malignant neoplasm of prostate: Secondary | ICD-10-CM | POA: Diagnosis not present

## 2019-08-26 DIAGNOSIS — C642 Malignant neoplasm of left kidney, except renal pelvis: Secondary | ICD-10-CM | POA: Diagnosis not present

## 2019-08-28 ENCOUNTER — Other Ambulatory Visit: Payer: Self-pay

## 2019-08-28 ENCOUNTER — Ambulatory Visit (HOSPITAL_COMMUNITY)
Admission: RE | Admit: 2019-08-28 | Discharge: 2019-08-28 | Disposition: A | Discharge status: Home / Self Care | Payer: Self-pay | Source: Ambulatory Visit | Attending: Urology | Admitting: Urology

## 2019-08-28 ENCOUNTER — Other Ambulatory Visit (HOSPITAL_COMMUNITY): Payer: Self-pay | Admitting: Urology

## 2019-08-28 DIAGNOSIS — R21 Rash and other nonspecific skin eruption: Secondary | ICD-10-CM | POA: Diagnosis not present

## 2019-08-28 DIAGNOSIS — C642 Malignant neoplasm of left kidney, except renal pelvis: Secondary | ICD-10-CM

## 2019-08-28 DIAGNOSIS — T07XXXA Unspecified multiple injuries, initial encounter: Secondary | ICD-10-CM | POA: Diagnosis not present

## 2019-08-29 ENCOUNTER — Ambulatory Visit (HOSPITAL_COMMUNITY)
Admission: RE | Admit: 2019-08-29 | Discharge: 2019-08-29 | Disposition: A | Discharge status: Home / Self Care | Payer: BC Managed Care – PPO | Source: Ambulatory Visit | Attending: Urology | Admitting: Urology

## 2019-08-29 DIAGNOSIS — C642 Malignant neoplasm of left kidney, except renal pelvis: Secondary | ICD-10-CM | POA: Insufficient documentation

## 2019-09-01 DIAGNOSIS — C642 Malignant neoplasm of left kidney, except renal pelvis: Secondary | ICD-10-CM | POA: Diagnosis not present

## 2019-12-15 DIAGNOSIS — Z23 Encounter for immunization: Secondary | ICD-10-CM | POA: Diagnosis not present

## 2020-04-16 DIAGNOSIS — Z0000 Encounter for general adult medical examination without abnormal findings: Secondary | ICD-10-CM | POA: Diagnosis not present

## 2020-04-16 DIAGNOSIS — I10 Essential (primary) hypertension: Secondary | ICD-10-CM | POA: Diagnosis not present

## 2020-04-16 DIAGNOSIS — Z13220 Encounter for screening for lipoid disorders: Secondary | ICD-10-CM | POA: Diagnosis not present

## 2020-05-11 DIAGNOSIS — Z85828 Personal history of other malignant neoplasm of skin: Secondary | ICD-10-CM | POA: Diagnosis not present

## 2020-05-11 DIAGNOSIS — L814 Other melanin hyperpigmentation: Secondary | ICD-10-CM | POA: Diagnosis not present

## 2020-05-11 DIAGNOSIS — L820 Inflamed seborrheic keratosis: Secondary | ICD-10-CM | POA: Diagnosis not present

## 2020-05-11 DIAGNOSIS — D2362 Other benign neoplasm of skin of left upper limb, including shoulder: Secondary | ICD-10-CM | POA: Diagnosis not present

## 2020-05-11 DIAGNOSIS — D225 Melanocytic nevi of trunk: Secondary | ICD-10-CM | POA: Diagnosis not present

## 2020-08-10 DIAGNOSIS — I10 Essential (primary) hypertension: Secondary | ICD-10-CM | POA: Diagnosis not present

## 2020-08-10 DIAGNOSIS — N5089 Other specified disorders of the male genital organs: Secondary | ICD-10-CM | POA: Diagnosis not present

## 2020-08-20 ENCOUNTER — Other Ambulatory Visit (HOSPITAL_COMMUNITY): Payer: Self-pay | Admitting: Urology

## 2020-08-20 ENCOUNTER — Other Ambulatory Visit: Payer: Self-pay

## 2020-08-20 ENCOUNTER — Ambulatory Visit (HOSPITAL_COMMUNITY)
Admission: RE | Admit: 2020-08-20 | Discharge: 2020-08-20 | Disposition: A | Discharge status: Home / Self Care | Payer: BC Managed Care – PPO | Source: Ambulatory Visit | Attending: Urology | Admitting: Urology

## 2020-08-20 DIAGNOSIS — C642 Malignant neoplasm of left kidney, except renal pelvis: Secondary | ICD-10-CM | POA: Diagnosis not present

## 2020-08-20 DIAGNOSIS — Z125 Encounter for screening for malignant neoplasm of prostate: Secondary | ICD-10-CM | POA: Diagnosis not present

## 2020-09-02 DIAGNOSIS — N430 Encysted hydrocele: Secondary | ICD-10-CM | POA: Diagnosis not present

## 2020-09-02 DIAGNOSIS — C642 Malignant neoplasm of left kidney, except renal pelvis: Secondary | ICD-10-CM | POA: Diagnosis not present

## 2020-09-08 DIAGNOSIS — N430 Encysted hydrocele: Secondary | ICD-10-CM | POA: Diagnosis not present

## 2020-09-28 DIAGNOSIS — H524 Presbyopia: Secondary | ICD-10-CM | POA: Diagnosis not present

## 2020-10-05 DIAGNOSIS — I10 Essential (primary) hypertension: Secondary | ICD-10-CM | POA: Diagnosis not present

## 2020-11-12 DIAGNOSIS — I10 Essential (primary) hypertension: Secondary | ICD-10-CM | POA: Diagnosis not present

## 2020-12-08 DIAGNOSIS — L719 Rosacea, unspecified: Secondary | ICD-10-CM | POA: Diagnosis not present

## 2020-12-08 DIAGNOSIS — I10 Essential (primary) hypertension: Secondary | ICD-10-CM | POA: Diagnosis not present

## 2020-12-14 DIAGNOSIS — Z1211 Encounter for screening for malignant neoplasm of colon: Secondary | ICD-10-CM | POA: Diagnosis not present

## 2020-12-14 DIAGNOSIS — K5730 Diverticulosis of large intestine without perforation or abscess without bleeding: Secondary | ICD-10-CM | POA: Diagnosis not present

## 2020-12-14 DIAGNOSIS — D123 Benign neoplasm of transverse colon: Secondary | ICD-10-CM | POA: Diagnosis not present

## 2020-12-31 DIAGNOSIS — Z23 Encounter for immunization: Secondary | ICD-10-CM | POA: Diagnosis not present

## 2020-12-31 DIAGNOSIS — D485 Neoplasm of uncertain behavior of skin: Secondary | ICD-10-CM | POA: Diagnosis not present

## 2020-12-31 DIAGNOSIS — L821 Other seborrheic keratosis: Secondary | ICD-10-CM | POA: Diagnosis not present

## 2020-12-31 DIAGNOSIS — L570 Actinic keratosis: Secondary | ICD-10-CM | POA: Diagnosis not present

## 2021-02-18 DIAGNOSIS — I10 Essential (primary) hypertension: Secondary | ICD-10-CM | POA: Diagnosis not present

## 2021-02-18 DIAGNOSIS — R0683 Snoring: Secondary | ICD-10-CM | POA: Diagnosis not present

## 2021-04-19 DIAGNOSIS — Z1159 Encounter for screening for other viral diseases: Secondary | ICD-10-CM | POA: Diagnosis not present

## 2021-04-19 DIAGNOSIS — I10 Essential (primary) hypertension: Secondary | ICD-10-CM | POA: Diagnosis not present

## 2021-04-19 DIAGNOSIS — Z0000 Encounter for general adult medical examination without abnormal findings: Secondary | ICD-10-CM | POA: Diagnosis not present

## 2021-05-09 DIAGNOSIS — L578 Other skin changes due to chronic exposure to nonionizing radiation: Secondary | ICD-10-CM | POA: Diagnosis not present

## 2021-05-09 DIAGNOSIS — L570 Actinic keratosis: Secondary | ICD-10-CM | POA: Diagnosis not present

## 2021-05-09 DIAGNOSIS — D239 Other benign neoplasm of skin, unspecified: Secondary | ICD-10-CM | POA: Diagnosis not present

## 2021-05-09 DIAGNOSIS — L821 Other seborrheic keratosis: Secondary | ICD-10-CM | POA: Diagnosis not present

## 2021-05-30 DIAGNOSIS — I10 Essential (primary) hypertension: Secondary | ICD-10-CM | POA: Diagnosis not present

## 2021-05-30 DIAGNOSIS — G4719 Other hypersomnia: Secondary | ICD-10-CM | POA: Diagnosis not present

## 2021-07-08 DIAGNOSIS — G4733 Obstructive sleep apnea (adult) (pediatric): Secondary | ICD-10-CM | POA: Diagnosis not present

## 2021-07-08 DIAGNOSIS — I10 Essential (primary) hypertension: Secondary | ICD-10-CM | POA: Diagnosis not present

## 2021-08-16 IMAGING — DX DG CHEST 2V
2 series · 2 of 2 positions shown · non-contrast
Comparison: 08/29/2019

CLINICAL DATA: Left kidney neoplasm.

EXAM:
CHEST - 2 VIEW

[chest pa]
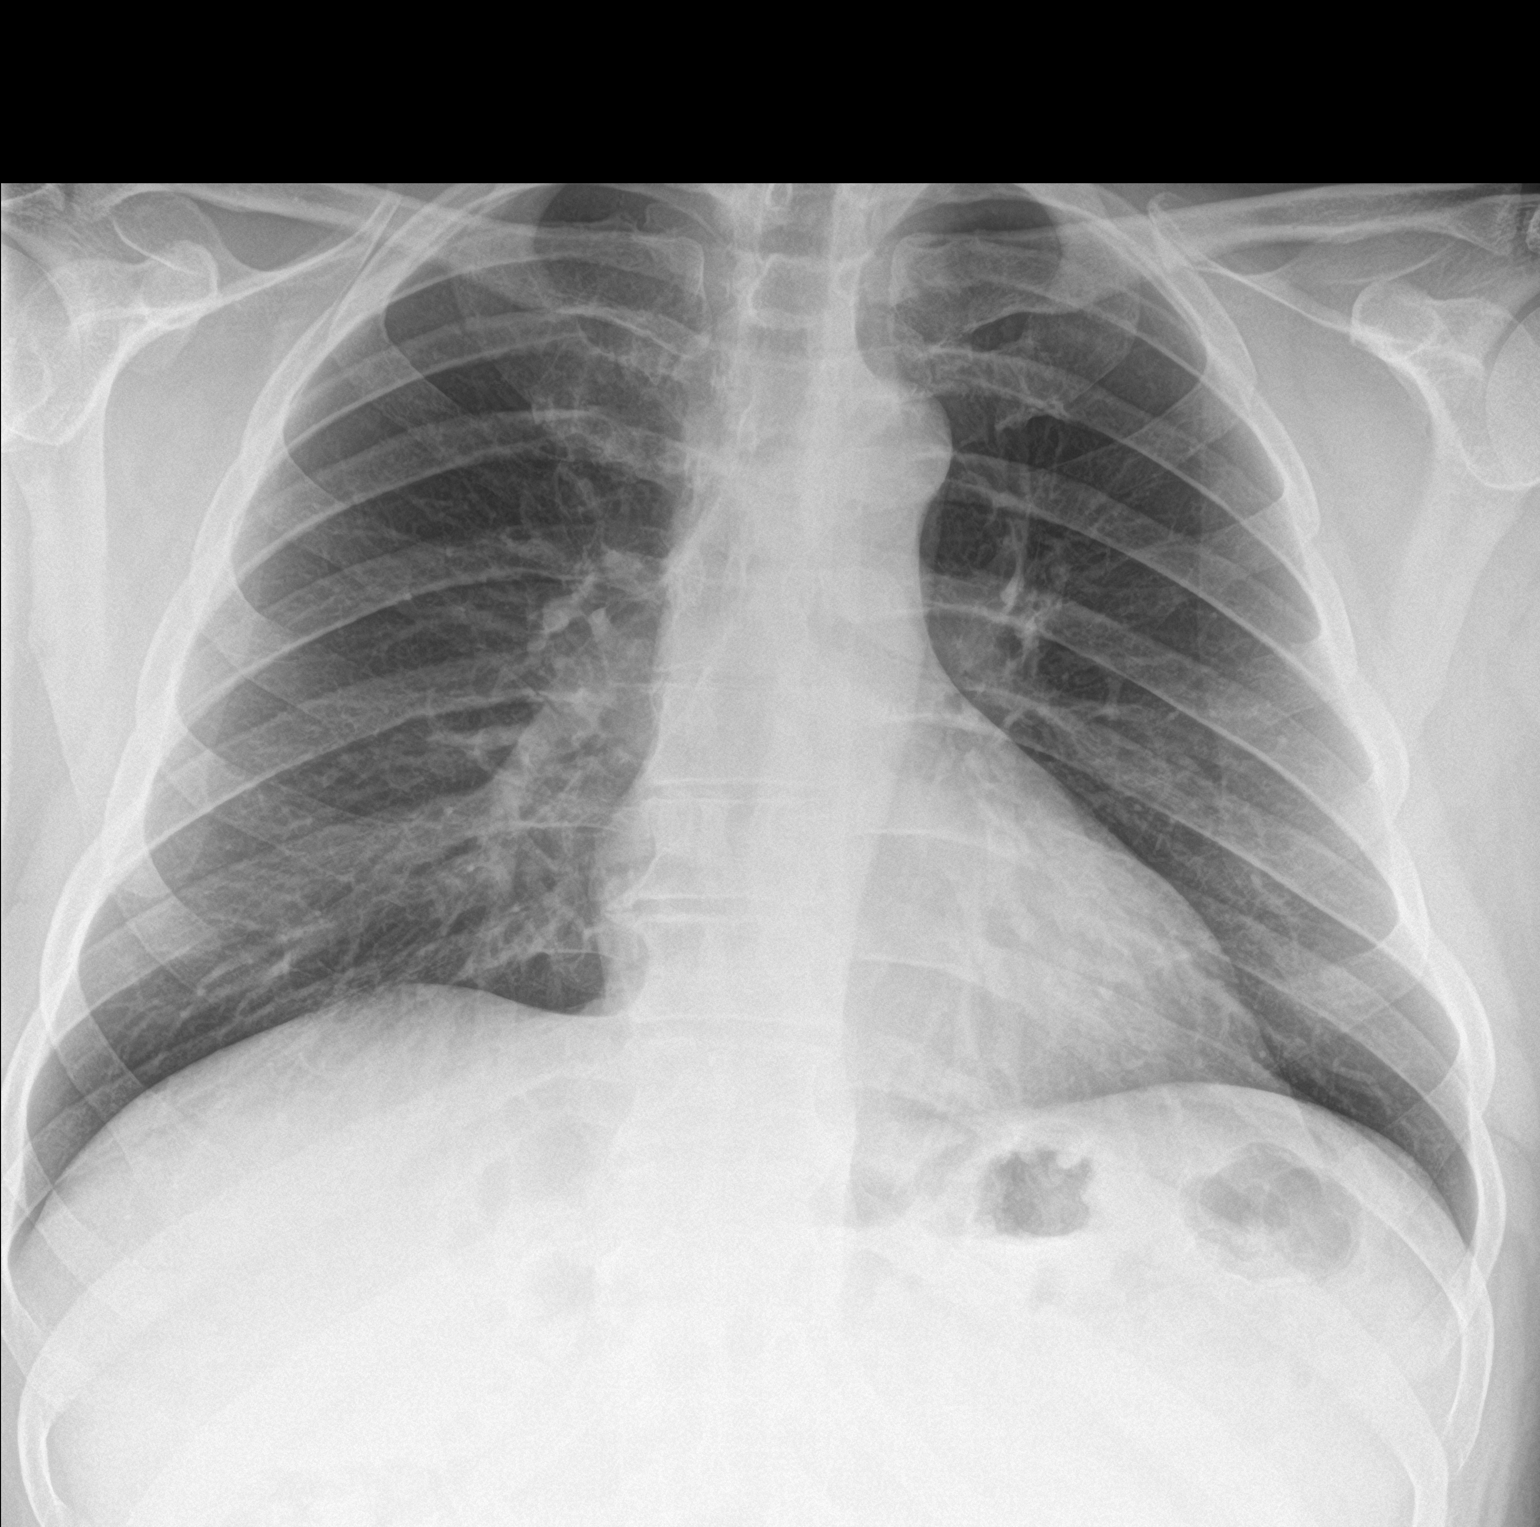

[chest lat]
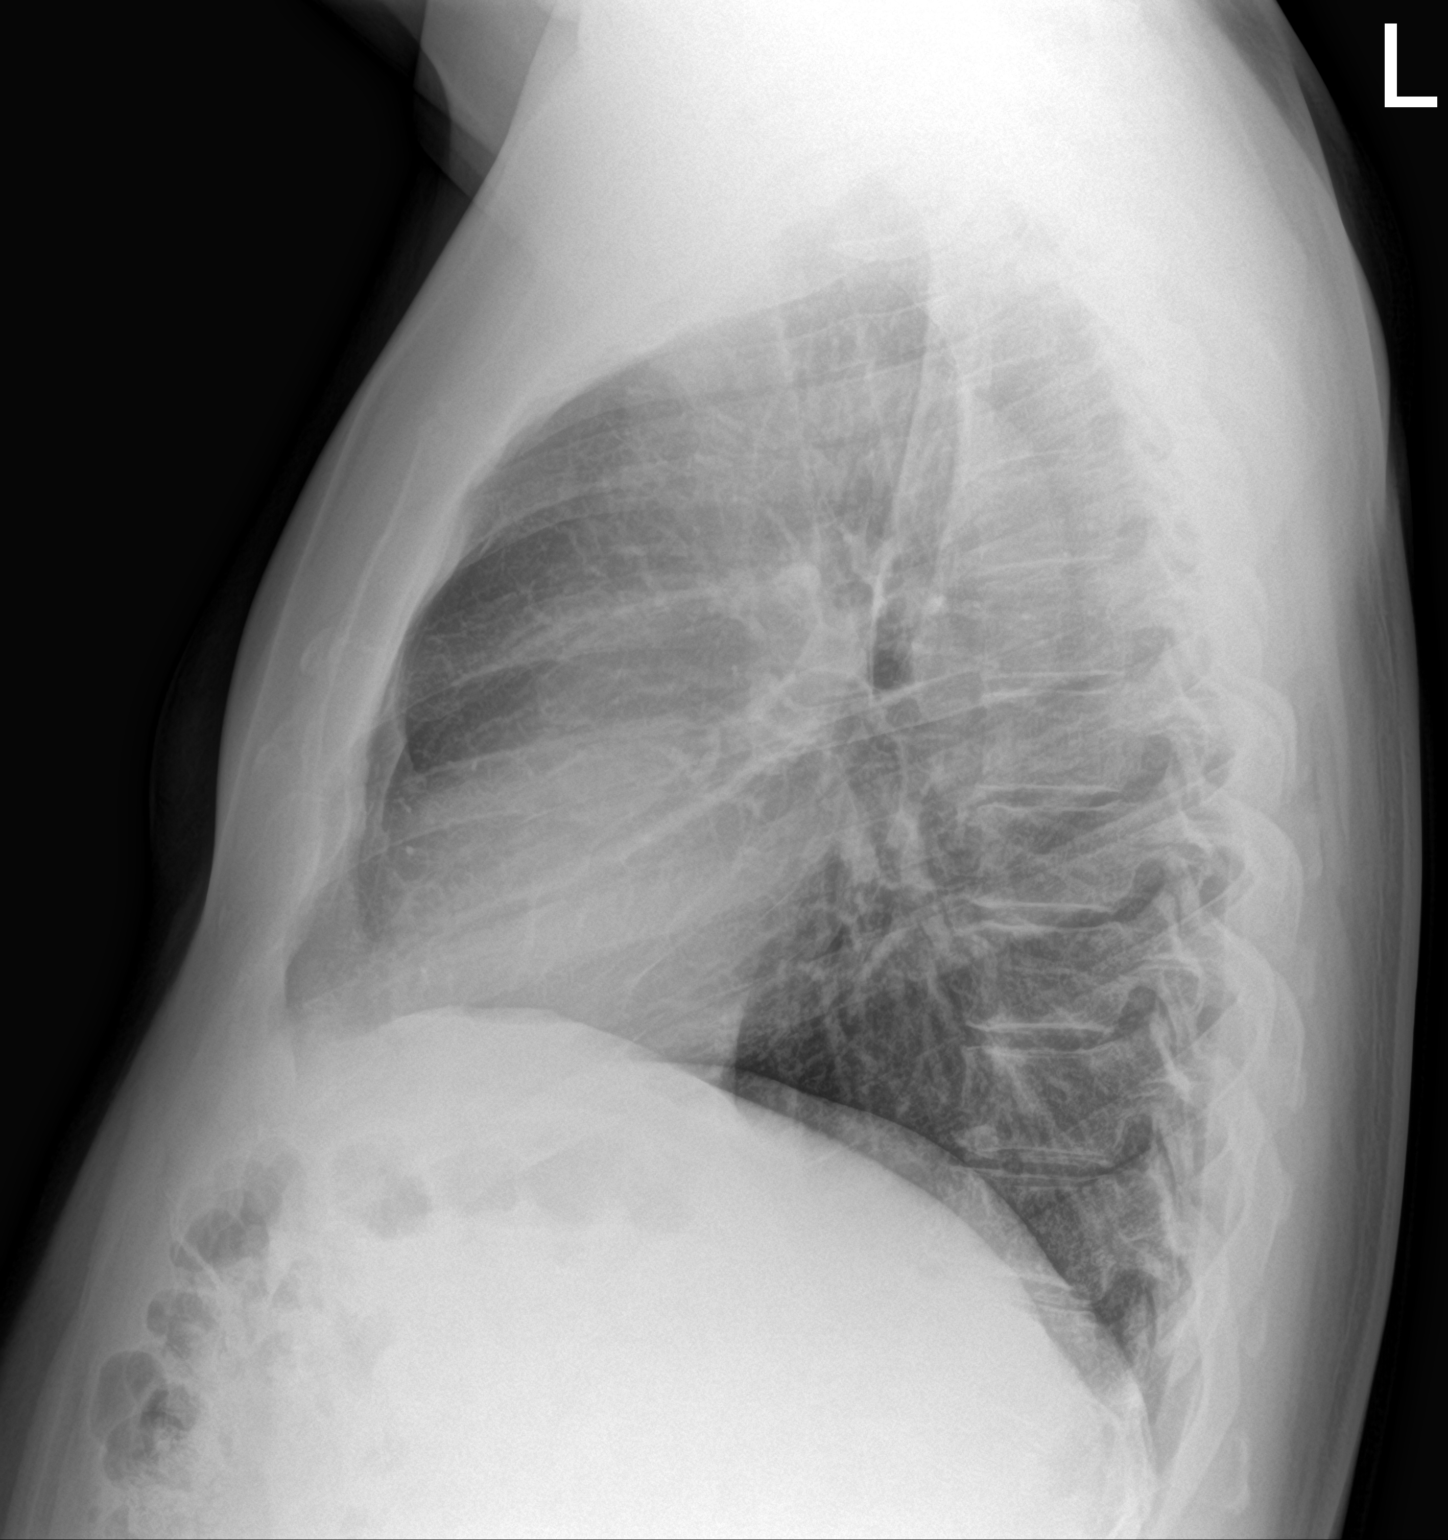

[2 of 2 positions shown; findings below may reference images not displayed]

FINDINGS: The heart size and mediastinal contours are within normal limits.
Both lungs are clear. The visualized skeletal structures are
unremarkable.
IMPRESSION: No active cardiopulmonary disease.

## 2021-08-30 ENCOUNTER — Ambulatory Visit (HOSPITAL_COMMUNITY)
Admission: RE | Admit: 2021-08-30 | Discharge: 2021-08-30 | Disposition: A | Discharge status: Home / Self Care | Payer: BC Managed Care – PPO | Source: Ambulatory Visit | Attending: Urology | Admitting: Urology

## 2021-08-30 ENCOUNTER — Other Ambulatory Visit (HOSPITAL_COMMUNITY): Payer: Self-pay | Admitting: Urology

## 2021-08-30 DIAGNOSIS — Z8553 Personal history of malignant neoplasm of renal pelvis: Secondary | ICD-10-CM | POA: Diagnosis not present

## 2021-08-30 DIAGNOSIS — K5750 Diverticulosis of both small and large intestine without perforation or abscess without bleeding: Secondary | ICD-10-CM | POA: Diagnosis not present

## 2021-08-30 DIAGNOSIS — C642 Malignant neoplasm of left kidney, except renal pelvis: Secondary | ICD-10-CM | POA: Insufficient documentation

## 2021-08-30 DIAGNOSIS — N3289 Other specified disorders of bladder: Secondary | ICD-10-CM | POA: Diagnosis not present

## 2021-08-30 DIAGNOSIS — Z125 Encounter for screening for malignant neoplasm of prostate: Secondary | ICD-10-CM | POA: Diagnosis not present

## 2021-08-30 DIAGNOSIS — N281 Cyst of kidney, acquired: Secondary | ICD-10-CM | POA: Diagnosis not present

## 2021-09-05 DIAGNOSIS — C642 Malignant neoplasm of left kidney, except renal pelvis: Secondary | ICD-10-CM | POA: Diagnosis not present

## 2021-09-05 DIAGNOSIS — N430 Encysted hydrocele: Secondary | ICD-10-CM | POA: Diagnosis not present

## 2021-10-18 DIAGNOSIS — I10 Essential (primary) hypertension: Secondary | ICD-10-CM | POA: Diagnosis not present

## 2021-10-18 DIAGNOSIS — E669 Obesity, unspecified: Secondary | ICD-10-CM | POA: Diagnosis not present

## 2021-10-27 DIAGNOSIS — G4733 Obstructive sleep apnea (adult) (pediatric): Secondary | ICD-10-CM | POA: Diagnosis not present

## 2021-11-27 DIAGNOSIS — G4733 Obstructive sleep apnea (adult) (pediatric): Secondary | ICD-10-CM | POA: Diagnosis not present

## 2021-12-27 DIAGNOSIS — G4733 Obstructive sleep apnea (adult) (pediatric): Secondary | ICD-10-CM | POA: Diagnosis not present

## 2022-01-27 DIAGNOSIS — G4733 Obstructive sleep apnea (adult) (pediatric): Secondary | ICD-10-CM | POA: Diagnosis not present

## 2022-02-09 DIAGNOSIS — I10 Essential (primary) hypertension: Secondary | ICD-10-CM | POA: Diagnosis not present

## 2022-02-09 DIAGNOSIS — G4733 Obstructive sleep apnea (adult) (pediatric): Secondary | ICD-10-CM | POA: Diagnosis not present

## 2022-02-09 DIAGNOSIS — E669 Obesity, unspecified: Secondary | ICD-10-CM | POA: Diagnosis not present

## 2022-02-26 DIAGNOSIS — G4733 Obstructive sleep apnea (adult) (pediatric): Secondary | ICD-10-CM | POA: Diagnosis not present

## 2022-02-28 ENCOUNTER — Telehealth: Payer: Self-pay | Admitting: *Deleted

## 2022-02-28 NOTE — Patient Outreach (Signed)
  Care Coordination   02/28/2022 Name: Larry Bender MRN: [id number] DOB: January 12, 1966   Care Coordination Outreach Attempts:  An unsuccessful telephone outreach was attempted today to offer the patient information about available care coordination services as a benefit of their health plan.   Follow Up Plan:  Additional outreach attempts will be made to offer the patient care coordination information and services.   Encounter Outcome:  No Answer   Care Coordination Interventions:  No, not indicated    Raina Mina, RN Care Management Coordinator [place] Office [phone]

## 2022-03-03 DIAGNOSIS — R051 Acute cough: Secondary | ICD-10-CM | POA: Diagnosis not present

## 2022-03-03 DIAGNOSIS — J019 Acute sinusitis, unspecified: Secondary | ICD-10-CM | POA: Diagnosis not present

## 2022-03-21 DIAGNOSIS — G4733 Obstructive sleep apnea (adult) (pediatric): Secondary | ICD-10-CM | POA: Diagnosis not present

## 2022-03-21 DIAGNOSIS — I10 Essential (primary) hypertension: Secondary | ICD-10-CM | POA: Diagnosis not present

## 2022-03-21 DIAGNOSIS — E669 Obesity, unspecified: Secondary | ICD-10-CM | POA: Diagnosis not present

## 2022-03-29 DIAGNOSIS — G4733 Obstructive sleep apnea (adult) (pediatric): Secondary | ICD-10-CM | POA: Diagnosis not present

## 2022-04-25 DIAGNOSIS — Z23 Encounter for immunization: Secondary | ICD-10-CM | POA: Diagnosis not present

## 2022-04-25 DIAGNOSIS — Z0000 Encounter for general adult medical examination without abnormal findings: Secondary | ICD-10-CM | POA: Diagnosis not present

## 2022-04-25 DIAGNOSIS — I10 Essential (primary) hypertension: Secondary | ICD-10-CM | POA: Diagnosis not present

## 2022-04-29 DIAGNOSIS — G4733 Obstructive sleep apnea (adult) (pediatric): Secondary | ICD-10-CM | POA: Diagnosis not present

## 2022-05-10 DIAGNOSIS — Z85828 Personal history of other malignant neoplasm of skin: Secondary | ICD-10-CM | POA: Diagnosis not present

## 2022-05-10 DIAGNOSIS — L814 Other melanin hyperpigmentation: Secondary | ICD-10-CM | POA: Diagnosis not present

## 2022-05-10 DIAGNOSIS — L570 Actinic keratosis: Secondary | ICD-10-CM | POA: Diagnosis not present

## 2022-05-10 DIAGNOSIS — D225 Melanocytic nevi of trunk: Secondary | ICD-10-CM | POA: Diagnosis not present

## 2022-05-10 DIAGNOSIS — D485 Neoplasm of uncertain behavior of skin: Secondary | ICD-10-CM | POA: Diagnosis not present

## 2022-05-10 DIAGNOSIS — L820 Inflamed seborrheic keratosis: Secondary | ICD-10-CM | POA: Diagnosis not present

## 2022-05-10 DIAGNOSIS — L578 Other skin changes due to chronic exposure to nonionizing radiation: Secondary | ICD-10-CM | POA: Diagnosis not present

## 2022-05-10 DIAGNOSIS — L821 Other seborrheic keratosis: Secondary | ICD-10-CM | POA: Diagnosis not present

## 2022-05-28 DIAGNOSIS — G4733 Obstructive sleep apnea (adult) (pediatric): Secondary | ICD-10-CM | POA: Diagnosis not present

## 2022-06-01 DIAGNOSIS — D225 Melanocytic nevi of trunk: Secondary | ICD-10-CM | POA: Diagnosis not present

## 2022-06-01 DIAGNOSIS — D239 Other benign neoplasm of skin, unspecified: Secondary | ICD-10-CM | POA: Diagnosis not present

## 2022-06-27 DIAGNOSIS — I10 Essential (primary) hypertension: Secondary | ICD-10-CM | POA: Diagnosis not present

## 2022-06-27 DIAGNOSIS — G4733 Obstructive sleep apnea (adult) (pediatric): Secondary | ICD-10-CM | POA: Diagnosis not present

## 2022-06-28 DIAGNOSIS — G4733 Obstructive sleep apnea (adult) (pediatric): Secondary | ICD-10-CM | POA: Diagnosis not present

## 2022-06-29 DIAGNOSIS — M79672 Pain in left foot: Secondary | ICD-10-CM | POA: Diagnosis not present

## 2022-06-29 DIAGNOSIS — I10 Essential (primary) hypertension: Secondary | ICD-10-CM | POA: Diagnosis not present

## 2022-07-05 ENCOUNTER — Ambulatory Visit (INDEPENDENT_AMBULATORY_CARE_PROVIDER_SITE_OTHER): Payer: BC Managed Care – PPO

## 2022-07-05 ENCOUNTER — Ambulatory Visit: Payer: BC Managed Care – PPO | Admitting: Podiatry

## 2022-07-05 ENCOUNTER — Encounter: Payer: Self-pay | Admitting: Podiatry

## 2022-07-05 DIAGNOSIS — M722 Plantar fascial fibromatosis: Secondary | ICD-10-CM

## 2022-07-05 NOTE — Patient Instructions (Addendum)
Plantar Fibroma with Rehab   EXERCISES- RANGE OF MOTION (ROM) AND STRETCHING EXERCISES - Plantar Fibroma These exercises may help you when beginning to rehabilitate your injury. Your symptoms may resolve with or without further involvement from your physician, physical therapist or athletic trainer. While completing these exercises, remember:  Restoring tissue flexibility helps normal motion to return to the joints. This allows healthier, less painful movement and activity. An effective stretch should be held for at least 30 seconds. A stretch should never be painful. You should only feel a gentle lengthening or release in the stretched tissue.  RANGE OF MOTION - Toe Extension, Flexion Sit with your right / left leg crossed over your opposite knee. Grasp your toes and gently pull them back toward the top of your foot. You should feel a stretch on the bottom of your toes and/or foot. Hold this stretch for 10 seconds. Now, gently pull your toes toward the bottom of your foot. You should feel a stretch on the top of your toes and or foot. Hold this stretch for 10 seconds. Repeat  times. Complete this stretch 3 times per day.   RANGE OF MOTION - Ankle Dorsiflexion, Active Assisted Remove shoes and sit on a chair that is preferably not on a carpeted surface. Place right / left foot under knee. Extend your opposite leg for support. Keeping your heel down, slide your right / left foot back toward the chair until you feel a stretch at your ankle or calf. If you do not feel a stretch, slide your bottom forward to the edge of the chair, while still keeping your heel down. Hold this stretch for 10 seconds. Repeat 3 times. Complete this stretch 2 times per day.   STRETCH  Gastroc, Standing Place hands on wall. Extend right / left leg, keeping the front knee somewhat bent. Slightly point your toes inward on your back foot. Keeping your right / left heel on the floor and your knee straight, shift  your weight toward the wall, not allowing your back to arch. You should feel a gentle stretch in the right / left calf. Hold this position for 10 seconds. Repeat 3 times. Complete this stretch 2 times per day.  STRETCH  Soleus, Standing Place hands on wall. Extend right / left leg, keeping the other knee somewhat bent. Slightly point your toes inward on your back foot. Keep your right / left heel on the floor, bend your back knee, and slightly shift your weight over the back leg so that you feel a gentle stretch deep in your back calf. Hold this position for 10 seconds. Repeat 3 times. Complete this stretch 2 times per day.  STRETCH  Gastrocsoleus, Standing  Note: This exercise can place a lot of stress on your foot and ankle. Please complete this exercise only if specifically instructed by your caregiver.  Place the ball of your right / left foot on a step, keeping your other foot firmly on the same step. Hold on to the wall or a rail for balance. Slowly lift your other foot, allowing your body weight to press your heel down over the edge of the step. You should feel a stretch in your right / left calf. Hold this position for 10 seconds. Repeat this exercise with a slight bend in your right / left knee. Repeat 3 times. Complete this stretch 2 times per day.   STRENGTHENING EXERCISES - Plantar Fasciitis (Heel Spur Syndrome)  These exercises may help you when beginning to  rehabilitate your injury. They may resolve your symptoms with or without further involvement from your physician, physical therapist or athletic trainer. While completing these exercises, remember:  Muscles can gain both the endurance and the strength needed for everyday activities through controlled exercises. Complete these exercises as instructed by your physician, physical therapist or athletic trainer. Progress the resistance and repetitions only as guided.  STRENGTH - Towel Curls Sit in a chair positioned on a  non-carpeted surface. Place your foot on a towel, keeping your heel on the floor. Pull the towel toward your heel by only curling your toes. Keep your heel on the floor. Repeat 3 times. Complete this exercise 2 times per day.  STRENGTH - Ankle Inversion Secure one end of a rubber exercise band/tubing to a fixed object (table, pole). Loop the other end around your foot just before your toes. Place your fists between your knees. This will focus your strengthening at your ankle. Slowly, pull your big toe up and in, making sure the band/tubing is positioned to resist the entire motion. Hold this position for 10 seconds. Have your muscles resist the band/tubing as it slowly pulls your foot back to the starting position. Repeat 3 times. Complete this exercises 2 times per day.  Document Released: 02/20/2005 Document Revised: 05/15/2011 Document Reviewed: 06/04/2008 [facility] Patient Information 2014 [place], [place].

## 2022-07-05 NOTE — Progress Notes (Signed)
  Subjective:  Patient ID: Larry Bender, male    DOB: 1965/04/28,   MRN: [id number]  Chief Complaint  Patient presents with   Foot Problem    left foot has knot on bottom of foot that is tender to touch    57 y.o. male presents for concern of knot on the bottom of the left foot. Relates he noticed it about a month ago. Relates it does not bother him with walking but does hurt if he puts direct pressure on the spot. Relates it does seem to have increased in size recently.  . Denies any other pedal complaints. Denies n/v/f/c.   Past Medical History:  Diagnosis Date   Headache    Migraine   History of hiatal hernia    Hypertension     Objective:  Physical Exam: Vascular: DP/PT pulses 2/4 bilateral. CFT <3 seconds. Normal hair growth on digits. No edema.  Skin. No lacerations or abrasions bilateral feet. Firm 1 cm nodule noted along the medial band of the plantar fascia. Some tenderness with palpation. Mobile with the plantar fascia.  Musculoskeletal: MMT 5/5 bilateral lower extremities in DF, PF, Inversion and Eversion. Deceased ROM in DF of ankle joint.  Neurological: Sensation intact to light touch.   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 X-rays reviewed and discussed with patient. No acute fractures or dislocations noted. Metadductus noted to the right foot. Cavus type foot  Discussed the diagnosis of fibroma and treatment options with patient. Discussed offloading of the area and proper shoe wear. Stretching exercises provided.  Discussed steroid injection into the area. Will defer today.  Did discuss surgical options and advised patient that fibroma resection is not 100% guaranteed. Patient to follow-up as needed if symptoms fail to improve or worsen.   Louann Sjogren, DPM

## 2022-07-28 DIAGNOSIS — G4733 Obstructive sleep apnea (adult) (pediatric): Secondary | ICD-10-CM | POA: Diagnosis not present

## 2022-09-04 ENCOUNTER — Ambulatory Visit (HOSPITAL_COMMUNITY)
Admission: RE | Admit: 2022-09-04 | Discharge: 2022-09-04 | Disposition: A | Discharge status: Home / Self Care | Payer: BC Managed Care – PPO | Source: Ambulatory Visit | Attending: Urology | Admitting: Urology

## 2022-09-04 ENCOUNTER — Other Ambulatory Visit (HOSPITAL_COMMUNITY): Payer: Self-pay | Admitting: Urology

## 2022-09-04 DIAGNOSIS — Z125 Encounter for screening for malignant neoplasm of prostate: Secondary | ICD-10-CM | POA: Diagnosis not present

## 2022-09-04 DIAGNOSIS — Z85528 Personal history of other malignant neoplasm of kidney: Secondary | ICD-10-CM | POA: Diagnosis not present

## 2022-09-04 DIAGNOSIS — C642 Malignant neoplasm of left kidney, except renal pelvis: Secondary | ICD-10-CM

## 2022-09-06 DIAGNOSIS — D49512 Neoplasm of unspecified behavior of left kidney: Secondary | ICD-10-CM | POA: Diagnosis not present

## 2022-09-06 DIAGNOSIS — C642 Malignant neoplasm of left kidney, except renal pelvis: Secondary | ICD-10-CM | POA: Diagnosis not present

## 2022-09-11 DIAGNOSIS — C642 Malignant neoplasm of left kidney, except renal pelvis: Secondary | ICD-10-CM | POA: Diagnosis not present

## 2022-12-05 DIAGNOSIS — H524 Presbyopia: Secondary | ICD-10-CM | POA: Diagnosis not present

## 2022-12-29 DIAGNOSIS — I10 Essential (primary) hypertension: Secondary | ICD-10-CM | POA: Diagnosis not present

## 2022-12-29 DIAGNOSIS — Z23 Encounter for immunization: Secondary | ICD-10-CM | POA: Diagnosis not present

## 2023-02-06 DIAGNOSIS — B079 Viral wart, unspecified: Secondary | ICD-10-CM | POA: Diagnosis not present

## 2023-02-06 DIAGNOSIS — B078 Other viral warts: Secondary | ICD-10-CM | POA: Diagnosis not present

## 2023-03-13 DIAGNOSIS — B078 Other viral warts: Secondary | ICD-10-CM | POA: Diagnosis not present

## 2023-03-26 DIAGNOSIS — Z03818 Encounter for observation for suspected exposure to other biological agents ruled out: Secondary | ICD-10-CM | POA: Diagnosis not present

## 2023-03-26 DIAGNOSIS — R0981 Nasal congestion: Secondary | ICD-10-CM | POA: Diagnosis not present

## 2023-03-26 DIAGNOSIS — R051 Acute cough: Secondary | ICD-10-CM | POA: Diagnosis not present

## 2023-03-26 DIAGNOSIS — R509 Fever, unspecified: Secondary | ICD-10-CM | POA: Diagnosis not present

## 2023-04-30 DIAGNOSIS — Z0000 Encounter for general adult medical examination without abnormal findings: Secondary | ICD-10-CM | POA: Diagnosis not present

## 2023-04-30 DIAGNOSIS — Z125 Encounter for screening for malignant neoplasm of prostate: Secondary | ICD-10-CM | POA: Diagnosis not present

## 2023-04-30 DIAGNOSIS — F39 Unspecified mood [affective] disorder: Secondary | ICD-10-CM | POA: Diagnosis not present

## 2023-04-30 DIAGNOSIS — I10 Essential (primary) hypertension: Secondary | ICD-10-CM | POA: Diagnosis not present

## 2023-05-10 DIAGNOSIS — Z85828 Personal history of other malignant neoplasm of skin: Secondary | ICD-10-CM | POA: Diagnosis not present

## 2023-05-10 DIAGNOSIS — L821 Other seborrheic keratosis: Secondary | ICD-10-CM | POA: Diagnosis not present

## 2023-05-10 DIAGNOSIS — L814 Other melanin hyperpigmentation: Secondary | ICD-10-CM | POA: Diagnosis not present

## 2023-05-10 DIAGNOSIS — L570 Actinic keratosis: Secondary | ICD-10-CM | POA: Diagnosis not present

## 2023-05-10 DIAGNOSIS — D485 Neoplasm of uncertain behavior of skin: Secondary | ICD-10-CM | POA: Diagnosis not present

## 2023-05-10 DIAGNOSIS — D2221 Melanocytic nevi of right ear and external auricular canal: Secondary | ICD-10-CM | POA: Diagnosis not present

## 2023-05-10 DIAGNOSIS — L578 Other skin changes due to chronic exposure to nonionizing radiation: Secondary | ICD-10-CM | POA: Diagnosis not present

## 2023-07-03 DIAGNOSIS — G4733 Obstructive sleep apnea (adult) (pediatric): Secondary | ICD-10-CM | POA: Diagnosis not present

## 2023-08-16 DIAGNOSIS — G4733 Obstructive sleep apnea (adult) (pediatric): Secondary | ICD-10-CM | POA: Diagnosis not present

## 2023-12-05 DIAGNOSIS — H524 Presbyopia: Secondary | ICD-10-CM | POA: Diagnosis not present
